# Patient Record
Sex: Female | Born: 1940 | Race: White | Hispanic: No | Marital: Married | Smoking: Never smoker
Health system: Southern US, Community
[De-identification: ages and names within clinical notes are randomized; demographics above are authoritative.]

## PROBLEM LIST (undated history)

## (undated) DIAGNOSIS — E669 Obesity, unspecified: Secondary | ICD-10-CM

## (undated) DIAGNOSIS — D472 Monoclonal gammopathy: Secondary | ICD-10-CM

## (undated) DIAGNOSIS — E1169 Type 2 diabetes mellitus with other specified complication: Secondary | ICD-10-CM

## (undated) DIAGNOSIS — I10 Essential (primary) hypertension: Secondary | ICD-10-CM

## (undated) DIAGNOSIS — K5090 Crohn's disease, unspecified, without complications: Secondary | ICD-10-CM

---

## 2006-10-06 ENCOUNTER — Inpatient Hospital Stay (HOSPITAL_COMMUNITY): Admission: RE | Admit: 2006-10-06 | Discharge: 2006-10-10 | Payer: Self-pay | Admitting: Orthopedic Surgery

## 2018-12-15 ENCOUNTER — Other Ambulatory Visit: Payer: Self-pay

## 2018-12-15 ENCOUNTER — Emergency Department
Admission: EM | Admit: 2018-12-15 | Discharge: 2018-12-15 | Disposition: A | Discharge status: Home / Self Care | Payer: Medicare HMO | Source: Home / Self Care

## 2018-12-15 ENCOUNTER — Encounter: Payer: Self-pay | Admitting: Emergency Medicine

## 2018-12-15 DIAGNOSIS — W010XXA Fall on same level from slipping, tripping and stumbling without subsequent striking against object, initial encounter: Secondary | ICD-10-CM

## 2018-12-15 DIAGNOSIS — L03116 Cellulitis of left lower limb: Secondary | ICD-10-CM

## 2018-12-15 DIAGNOSIS — S81812A Laceration without foreign body, left lower leg, initial encounter: Secondary | ICD-10-CM | POA: Diagnosis not present

## 2018-12-15 MED ORDER — CLINDAMYCIN HCL 300 MG PO CAPS: 300.0000 mg | ORAL_CAPSULE | Freq: Three times a day (TID) | ORAL | 0 refills | Status: DC

## 2018-12-15 NOTE — ED Provider Notes (Signed)
[facility]    CSN: [id number] Arrival date & time: 12/15/18  1153     History   Chief Complaint Chief Complaint  Patient presents with  . Leg Injury    HPI Katherine Benson is a 78 y.o. female.   HPI  Katherine Benson is a 78 y.o. female presenting to UC with c/o gradually worsening pain, redness, and swelling to her Left lower leg for 2 days. Two days ago pt missed a step on her back porch causing her to stumble, her husband caught her before she fell but she still scraped the front of her shin resulting in 2 skin tears. Pain is aching and sore. Leg is hot to the touch. She and her husband were trying to treat the wound at home with Neosporin and iodine but wound kept getting worse.  Hx of cellulitis in the past. She never needed to be hospitalized at that time but did receive antibiotics. 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 Denies fever, chills, n/v/d.    History reviewed. No pertinent past medical history.  There are no active problems to display for this patient.   History reviewed. No pertinent surgical history.  OB History   No obstetric history on file.      Home Medications    Prior to Admission medications   Medication Sig Start Date End Date Taking? Authorizing Provider  Dicyclomine HCl (BENTYL PO) Take by mouth.   Yes [provider]  Omeprazole (PRILOSEC PO) Take by mouth.   Yes [provider]  clindamycin (CLEOCIN) 300 MG capsule Take 1 capsule (300 mg total) by mouth 3 (three) times daily for 7 days. 12/15/18 12/22/18  Noe Gens, PA-C    Family History No family history on file.  Social History Social History   Tobacco Use  . Smoking status: Never Smoker  . Smokeless tobacco: Never Used  Substance Use Topics  . Alcohol use: Not on file  . Drug use: Not on file     Allergies   Penicillins; Sulfa antibiotics; and Erythromycin   Review of Systems Review of Systems  Constitutional: Negative for chills  and fever.  Cardiovascular: Positive for leg swelling (Left lower).  Musculoskeletal: Positive for myalgias. Negative for arthralgias.  Skin: Positive for color change and wound.  Neurological: Negative for weakness and numbness.     Physical Exam Triage Vital Signs ED Triage Vitals  Enc Vitals Group     BP 12/15/18 1224 (!) 161/95     Pulse Rate 12/15/18 1224 89     Resp --      Temp 12/15/18 1224 98.2 F (36.8 C)     Temp Source 12/15/18 1224 Oral     SpO2 12/15/18 1224 97 %     Weight 12/15/18 1225 145 lb 4 oz (65.9 kg)     Height 12/15/18 1225 5' (1.524 m)     Head Circumference --      Peak Flow --      Pain Score 12/15/18 1225 0     Pain Loc --      Pain Edu? --      Excl. in [place]? --    No data found.  Updated Vital Signs BP (!) 161/95 (BP Location: Right Arm)   Pulse 89   Temp 98.2 F (36.8 C) (Oral)   Ht 5' (1.524 m)   Wt 145 lb 4 oz (65.9 kg)   SpO2 97%   BMI 28.37 kg/m  Visual Acuity Right Eye Distance:   Left Eye Distance:   Bilateral Distance:    Right Eye Near:   Left Eye Near:    Bilateral Near:     Physical Exam Vitals signs and nursing note reviewed.  Constitutional:      Appearance: She is well-developed.  HENT:     Head: Normocephalic and atraumatic.  Neck:     Musculoskeletal: Normal range of motion.  Cardiovascular:     Rate and Rhythm: Normal rate.  Pulmonary:     Effort: Pulmonary effort is normal.  Musculoskeletal: Normal range of motion.        General: Tenderness present.     Comments: Left lower leg: mild to moderate edema (compression stocking on Right leg- chronic edema). Calf is soft, mild diffuse tenderness to lower leg.  Full ROM knee and ankle.  Skin:    General: Skin is warm and dry.     Findings: Abrasion and erythema present.          Comments: Left lower leg: 2 large superficial skin tears to anterior leg. Surrounding erythema and warmth. Tender. No drainage. No induration or fluctuance.   Neurological:      Mental Status: She is alert and oriented to person, place, and time.  Psychiatric:        Behavior: Behavior normal.      UC Treatments / Results  Labs (all labs ordered are listed, but only abnormal results are displayed) Labs Reviewed - No data to display  EKG None  Radiology No results found.  Procedures Procedures (including critical care time)  Medications Ordered in UC Medications - No data to display  Initial Impression / Assessment and Plan / UC Course  I have reviewed the triage vital signs and the nursing notes.  Pertinent labs & imaging results that were available during my care of the patient were reviewed by me and considered in my medical decision making (see chart for details).    Wound flushed with 60cc of warm water and hibiclens. Pat dried. Mepilex bandage and ace wrap applied to lower leg Will start pt on Clindamycin.  Considered Rocephin IM in UC, however, pt is allergic to PCN  Encouraged f/u in 2 days Discussed symptoms that warrant emergent care in the ED. AVS provided.  Final Clinical Impressions(s) / UC Diagnoses   Final diagnoses:  Fall from slip, trip, or stumble, initial encounter  Cellulitis of left lower leg  Tear of skin of multiple sites of left lower extremity, initial encounter     Discharge Instructions      It is recommended that you keep your leg elevated as this weekend. Keep ace wrap in place unless it becomes too tight.  If it feels too loose or too tight, you may adjust it to provide mild compression to your lower leg.  Keep bandage in place until you are re-evaluated at this facility on Monday to make sure your wound is healing properly.  If symptoms significantly worsening this weekend, please go to the hospital for further evaluation and treatment.     ED Prescriptions    Medication Sig Dispense Auth. Provider   clindamycin (CLEOCIN) 300 MG capsule Take 1 capsule (300 mg total) by mouth 3 (three) times daily  for 7 days. 21 capsule Noe Gens, PA-C     Controlled Substance Prescriptions [place] Controlled Substance Registry consulted? Not Applicable   Tyrell Antonio 12/15/18 1340

## 2018-12-15 NOTE — ED Triage Notes (Signed)
Patient injured her left lower leg Thursday afternoon, now swelling, pain, redness.

## 2018-12-15 NOTE — Discharge Instructions (Signed)
°  It is recommended that you keep your leg elevated as this weekend. Keep ace wrap in place unless it becomes too tight.  If it feels too loose or too tight, you may adjust it to provide mild compression to your lower leg.  Keep bandage in place until you are re-evaluated at this facility on Monday to make sure your wound is healing properly.  If symptoms significantly worsening this weekend, please go to the hospital for further evaluation and treatment.

## 2018-12-17 ENCOUNTER — Other Ambulatory Visit: Payer: Self-pay

## 2018-12-17 ENCOUNTER — Emergency Department (INDEPENDENT_AMBULATORY_CARE_PROVIDER_SITE_OTHER)
Admission: EM | Admit: 2018-12-17 | Discharge: 2018-12-17 | Disposition: A | Discharge status: Home / Self Care | Payer: Medicare HMO | Source: Home / Self Care | Attending: Family Medicine | Admitting: Family Medicine

## 2018-12-17 DIAGNOSIS — Z5189 Encounter for other specified aftercare: Secondary | ICD-10-CM

## 2018-12-17 HISTORY — DX: Essential (primary) hypertension: I10

## 2018-12-17 NOTE — ED Triage Notes (Signed)
Pt fell on Saturday and was seen for left leg injury.  Skin tears and redness noted on lower left leg.

## 2018-12-17 NOTE — ED Provider Notes (Signed)
[facility]    CSN: [id number] Arrival date & time: 12/17/18  0904     History   Chief Complaint Chief Complaint  Patient presents with  . Wound Check    HPI Katherine Benson is a 78 y.o. female.   Patient return for follow-up of abrasions/cellulitis of left lower anterior leg.  She was started on clindamycin two days ago.  She reports that she has noticed significant decrease in pain.  She has worn her Mepelex dressing and ace wrap continuously.  She denies fevers, chills, and sweats.  No chest pain or shortness of breath.  The history is provided by the patient.    Past Medical History:  Diagnosis Date  . Hypertension     There are no active problems to display for this patient.   History reviewed. No pertinent surgical history.  OB History   No obstetric history on file.      Home Medications    Prior to Admission medications   Medication Sig Start Date End Date Taking? Authorizing Provider  calcium carbonate (TUMS - DOSED IN MG ELEMENTAL CALCIUM) 500 MG chewable tablet Chew by mouth. 04/02/10  Yes [provider]  Biotin (SUPER BIOTIN) 5 MG TABS Take by mouth.    [provider]  clindamycin (CLEOCIN) 300 MG capsule Take 1 capsule (300 mg total) by mouth 3 (three) times daily for 7 days. 12/15/18 12/22/18  Noe Gens, PA-C  denosumab (PROLIA) 60 MG/ML SOSY injection Inject into the skin.    [provider]  dicyclomine (BENTYL) 10 MG capsule  10/11/18   [provider]  Dicyclomine HCl (BENTYL PO) Take by mouth.    [provider]  docusate sodium (COLACE) 100 MG capsule Take by mouth.    [provider]  Omeprazole (PRILOSEC PO) Take by mouth.    [provider]  omeprazole (PRILOSEC) 40 MG capsule TAKE ONE CAPSULE (40 MG DOSE) BY MOUTH DAILY. 11/01/18   [provider]  triamcinolone (NASACORT) 55 MCG/ACT AERO nasal inhaler Place into the nose.    [provider]     Family History History reviewed. No pertinent family history.  Social History Social History   Tobacco Use  . Smoking status: Never Smoker  . Smokeless tobacco: Never Used  Substance Use Topics  . Alcohol use: Not on file  . Drug use: Not on file     Allergies   Penicillins; Sulfa antibiotics; and Erythromycin   Review of Systems Review of Systems  Constitutional: Negative for activity change, appetite change, chills, diaphoresis, fatigue and fever.  Musculoskeletal:       Left lower leg pain/swelling  All other systems reviewed and are negative.    Physical Exam Triage Vital Signs ED Triage Vitals  Enc Vitals Group     BP 12/17/18 0932 (!) 183/104     Pulse Rate 12/17/18 0932 86     Resp 12/17/18 0932 20     Temp --      Temp src --      SpO2 12/17/18 0932 93 %     Weight --      Height --      Head Circumference --      Peak Flow --      Pain Score 12/17/18 0933 0     Pain Loc --      Pain Edu? --      Excl. in [place]? --    No data found.  Updated  Vital Signs BP (!) 183/104 (BP Location: Right Arm)   Pulse 86   Resp 20   SpO2 93%   Visual Acuity Right Eye Distance:   Left Eye Distance:   Bilateral Distance:    Right Eye Near:   Left Eye Near:    Bilateral Near:     Physical Exam Nursing note reviewed.  Constitutional:      General: She is not in acute distress. HENT:     Head: Normocephalic.  Eyes:     Pupils: Pupils are equal, round, and reactive to light.  Cardiovascular:     Rate and Rhythm: Normal rate.  Pulmonary:     Effort: Pulmonary effort is normal.  Musculoskeletal:     Left lower leg: She exhibits tenderness.       Legs:     Comments: Left lower leg pre-tibial area has two skin tear/abrasions with good granulation tissue and no purulent drainage.  The surrounding area remains erythematous, as noted on diagram, and tender to palpation.  Minimal edema present.  Pedal pulses intact. No calf tenderness to palpation.  Skin:     General: Skin is warm and dry.  Neurological:     Mental Status: She is alert.      UC Treatments / Results  Labs (all labs ordered are listed, but only abnormal results are displayed) Labs Reviewed  WOUND CULTURE    EKG None  Radiology No results found.  Procedures Procedures (including critical care time)  Medications Ordered in UC Medications - No data to display  Initial Impression / Assessment and Plan / UC Course  I have reviewed the triage vital signs and the nursing notes.  Pertinent labs & imaging results that were available during my care of the patient were reviewed by me and considered in my medical decision making (see chart for details).    Cellulitis slightly improved. Cleaned wound gently with sterile saline; wound culture pending. Reapplied Mepelex Border dressings (2 dressings:  10cm by 10cm). Applied ace wraps from toes to below knee in graduated compression fashion. Return in 2 days for follow-up.   Final Clinical Impressions(s) / UC Diagnoses   Final diagnoses:  Visit for wound check     Discharge Instructions     Wear ace wrap.  Elevate leg whenever possible.  Leave bandage in place until follow-up visit. Continue clindaymcin. May take Tylenol if needed for pain. If symptoms become significantly worse during the night or over the weekend, proceed to the local emergency room.     ED Prescriptions    None         Kandra Nicolas, MD 12/17/18 1047

## 2018-12-17 NOTE — Discharge Instructions (Addendum)
Wear ace wrap.  Elevate leg whenever possible. Continue clindaymcin. May take Tylenol if needed for pain. If symptoms become significantly worse during the night or over the weekend, proceed to the local emergency room.

## 2018-12-19 ENCOUNTER — Emergency Department (INDEPENDENT_AMBULATORY_CARE_PROVIDER_SITE_OTHER)
Admission: EM | Admit: 2018-12-19 | Discharge: 2018-12-19 | Disposition: A | Discharge status: Home / Self Care | Payer: Medicare HMO | Source: Home / Self Care | Attending: Family Medicine | Admitting: Family Medicine

## 2018-12-19 DIAGNOSIS — Z5189 Encounter for other specified aftercare: Secondary | ICD-10-CM | POA: Diagnosis not present

## 2018-12-19 NOTE — ED Provider Notes (Signed)
[name]    CSN: [id number] Arrival date & time: 12/19/18  3220     History   Chief Complaint Chief Complaint  Patient presents with  . Wound Check    HPI Macklyn Glandon is a 78 y.o. female.   Patient returns for dressing change to wounds on her left lower leg without complaints.  The history is provided by the patient.    Past Medical History:  Diagnosis Date  . Hypertension     There are no active problems to display for this patient.   History reviewed. No pertinent surgical history.  OB History   No obstetric history on file.      Home Medications    Prior to Admission medications   Medication Sig Start Date End Date Taking? Authorizing Provider  Biotin (SUPER BIOTIN) 5 MG TABS Take by mouth.    [provider]  calcium carbonate (TUMS - DOSED IN MG ELEMENTAL CALCIUM) 500 MG chewable tablet Chew by mouth. 04/02/10   [provider]  clindamycin (CLEOCIN) 300 MG capsule Take 1 capsule (300 mg total) by mouth 3 (three) times daily for 7 days. 12/15/18 12/22/18  Noe Gens, PA-C  denosumab (PROLIA) 60 MG/ML SOSY injection Inject into the skin.    [provider]  dicyclomine (BENTYL) 10 MG capsule  10/11/18   [provider]  Dicyclomine HCl (BENTYL PO) Take by mouth.    [provider]  docusate sodium (COLACE) 100 MG capsule Take by mouth.    [provider]  Omeprazole (PRILOSEC PO) Take by mouth.    [provider]  omeprazole (PRILOSEC) 40 MG capsule TAKE ONE CAPSULE (40 MG DOSE) BY MOUTH DAILY. 11/01/18   [provider]  triamcinolone (NASACORT) 55 MCG/ACT AERO nasal inhaler Place into the nose.    [provider]    Family History History reviewed. No pertinent family history.  Social History Social History   Tobacco Use  . Smoking status: Never Smoker  . Smokeless tobacco: Never Used  Substance Use Topics  . Alcohol use: Not on file  . Drug use: Not  on file     Allergies   Penicillins; Sulfa antibiotics; and Erythromycin   Review of Systems Review of Systems Patient reports less pain in her left lower leg.  She denies fevers, chills, and sweats.  Physical Exam Triage Vital Signs ED Triage Vitals  Enc Vitals Group     BP 12/19/18 0921 (!) 170/93     Pulse Rate 12/19/18 0921 90     Resp 12/19/18 0921 20     Temp 12/19/18 0921 97.9 F (36.6 C)     Temp Source 12/19/18 0921 Oral     SpO2 12/19/18 0921 93 %     Weight --      Height --      Head Circumference --      Peak Flow --      Pain Score 12/19/18 0922 0     Pain Loc --      Pain Edu? --      Excl. in [place]? --    No data found.  Updated Vital Signs BP (!) 170/93 (BP Location: Right Arm)   Pulse 90   Temp 97.9 F (36.6 C) (Oral)   Resp 20   SpO2 93%   Visual Acuity Right Eye Distance:   Left Eye Distance:   Bilateral Distance:    Right Eye Near:   Left Eye Near:  Bilateral Near:     Physical Exam Nursing notes and Vital Signs reviewed. Appearance:  Patient appears stated age, and in no acute distress.    Left lower leg after removal of dressings:  Excellent granulation tissue without drainage.  Minimal surrounding erythema and tenderness to palpation.  No edema.    UC Treatments / Results  Labs (all labs ordered are listed, but only abnormal results are displayed) Labs Reviewed - No data to display  EKG None  Radiology No results found.  Procedures Procedures (including critical care time)  Medications Ordered in UC Medications - No data to display  Initial Impression / Assessment and Plan / UC Course  I have reviewed the triage vital signs and the nursing notes.  Pertinent labs & imaging results that were available during my care of the patient were reviewed by me and considered in my medical decision making (see chart for details).    Wounds healing well.  Cellulitis resolving.  Culture pending. Cleaned wounds with sterile  saline.  Reapplied Mepelex Border dressings and ace wraps in graduated compression fashion. Return in 3 days for wound check and replace Mepelex dressings.   Final Clinical Impressions(s) / UC Diagnoses   Final diagnoses:  Visit for wound check     Discharge Instructions         Wear ace wrap.  Elevate leg whenever possible.  Leave bandage in place until follow-up visit. Finish clindaymcin. May take Tylenol if needed for pain. If symptoms become significantly worse during the night or over the weekend, proceed to the local emergency room.     ED Prescriptions    None        Kandra Nicolas, MD 12/20/18 1527

## 2018-12-19 NOTE — ED Triage Notes (Signed)
Pt here for wound check of left leg.

## 2018-12-19 NOTE — Discharge Instructions (Addendum)
°  Wear ace wrap.  Elevate leg whenever possible.  Leave bandage in place until follow-up visit. Finish clindaymcin. May take Tylenol if needed for pain. If symptoms become significantly worse during the night or over the weekend, proceed to the local emergency room.

## 2018-12-20 ENCOUNTER — Telehealth: Payer: Self-pay

## 2018-12-20 LAB — WOUND CULTURE
MICRO NUMBER:: 377326
RESULT:: NO GROWTH
SPECIMEN QUALITY:: ADEQUATE

## 2018-12-20 NOTE — Telephone Encounter (Signed)
Called patient today with lab results from recent visit.  Informed her of negative lab results and to insure to be back on Saturday for follow up wound care.

## 2018-12-22 ENCOUNTER — Emergency Department
Admission: EM | Admit: 2018-12-22 | Discharge: 2018-12-22 | Disposition: A | Discharge status: Home / Self Care | Payer: Medicare HMO | Source: Home / Self Care | Attending: Family Medicine | Admitting: Family Medicine

## 2018-12-22 ENCOUNTER — Other Ambulatory Visit: Payer: Self-pay

## 2018-12-22 ENCOUNTER — Encounter: Payer: Self-pay | Admitting: Emergency Medicine

## 2018-12-22 DIAGNOSIS — Z4800 Encounter for change or removal of nonsurgical wound dressing: Secondary | ICD-10-CM

## 2018-12-22 MED ORDER — MUPIROCIN 2 % EX OINT: 1.0000 "application " | TOPICAL_OINTMENT | Freq: Two times a day (BID) | CUTANEOUS | 0 refills | Status: DC

## 2018-12-22 MED ORDER — CLINDAMYCIN HCL 300 MG PO CAPS: 300.0000 mg | ORAL_CAPSULE | Freq: Three times a day (TID) | ORAL | 0 refills | Status: AC

## 2018-12-22 NOTE — ED Triage Notes (Signed)
Patient here for recheck of wound on left lower leg and dressing change.  Redness in area is better, patient feels better, has one day of antibiotics left.

## 2018-12-22 NOTE — ED Provider Notes (Signed)
[name]    CSN: [id number] Arrival date & time: 12/22/18  4944     History   Chief Complaint Chief Complaint  Patient presents with  . Wound check    HPI Gurneet Matarese is a 78 y.o. female.   Patient returns for follow-up of abrasions/cellulitis on left lower leg.  She reports resolution of pain, and minimal erythema around the wound.  She continues to wear ace wraps and elevate leg.  The history is provided by the patient.    Past Medical History:  Diagnosis Date  . Hypertension     There are no active problems to display for this patient.   History reviewed. No pertinent surgical history.  OB History   No obstetric history on file.      Home Medications    Prior to Admission medications   Medication Sig Start Date End Date Taking? Authorizing Provider  Biotin (SUPER BIOTIN) 5 MG TABS Take by mouth.    [provider]  calcium carbonate (TUMS - DOSED IN MG ELEMENTAL CALCIUM) 500 MG chewable tablet Chew by mouth. 04/02/10   [provider]  clindamycin (CLEOCIN) 300 MG capsule Take 1 capsule (300 mg total) by mouth 3 (three) times daily for 5 days. 12/22/18 12/27/18  Kandra Nicolas, MD  denosumab (PROLIA) 60 MG/ML SOSY injection Inject into the skin.    [provider]  dicyclomine (BENTYL) 10 MG capsule  10/11/18   [provider]  Dicyclomine HCl (BENTYL PO) Take by mouth.    [provider]  docusate sodium (COLACE) 100 MG capsule Take by mouth.    [provider]  mupirocin ointment (BACTROBAN) 2 % Apply 1 application topically 2 (two) times daily. 12/22/18   Kandra Nicolas, MD  Omeprazole (PRILOSEC PO) Take by mouth.    [provider]  omeprazole (PRILOSEC) 40 MG capsule TAKE ONE CAPSULE (40 MG DOSE) BY MOUTH DAILY. 11/01/18   [provider]  triamcinolone (NASACORT) 55 MCG/ACT AERO nasal inhaler Place into the nose.    [provider]    Family History No  family history on file.  Social History Social History   Tobacco Use  . Smoking status: Never Smoker  . Smokeless tobacco: Never Used  Substance Use Topics  . Alcohol use: Not on file  . Drug use: Not on file     Allergies   Penicillins; Sulfa antibiotics; and Erythromycin   Review of Systems Review of Systems  Constitutional: Negative for chills, diaphoresis, fatigue and fever.  Skin: Negative for color change.  All other systems reviewed and are negative.    Physical Exam Triage Vital Signs ED Triage Vitals [12/22/18 0925]  Enc Vitals Group     BP (!) 183/98     Pulse Rate 87     Resp      Temp 97.9 F (36.6 C)     Temp Source Oral     SpO2 91 %     Weight      Height      Head Circumference      Peak Flow      Pain Score 0     Pain Loc      Pain Edu?      Excl. in [place]?    No data found.  Updated Vital Signs BP (!) 183/98 (BP Location: Right Arm)   Pulse 87   Temp 97.9 F (36.6 C) (Oral)   SpO2 91%   Visual Acuity  Right Eye Distance:   Left Eye Distance:   Bilateral Distance:    Right Eye Near:   Left Eye Near:    Bilateral Near:     Physical Exam Vitals signs and nursing note reviewed.  Constitutional:      General: She is not in acute distress. Cardiovascular:     Rate and Rhythm: Normal rate.  Pulmonary:     Effort: Pulmonary effort is normal.  Musculoskeletal:     Comments: Wounds over the pretibial area of the left lower leg reveal excellent granulation tissue and no purulent drainage.  Surrounding erythema is almost completely resolved, and there is minimal tenderness to palpation around the wound.  Neurological:     Mental Status: She is alert.      UC Treatments / Results  Labs (all labs ordered are listed, but only abnormal results are displayed) Labs Reviewed - No data to display  EKG None  Radiology No results found.  Procedures Procedures (including critical care time)  Medications Ordered in UC Medications -  No data to display  Initial Impression / Assessment and Plan / UC Course  I have reviewed the triage vital signs and the nursing notes.  Pertinent labs & imaging results that were available during my care of the patient were reviewed by me and considered in my medical decision making (see chart for details).    Wounds healing well; excellent granulation tissue present.  Cellulitis almost completely resolved.  Cleaned wounds with sterile saline.  Applied [employer] dressing.  Reapplied ace wraps in graduated compression fashion. Rx for clindamycin 300mg  #15. Rx for mupirocin ointment for use after finishing clindamycin ointment. After removing bandage in five days, may begin daily dressing at home with Telfa (patient may return here in five days for follow-up if she wishes).   Final Clinical Impressions(s) / UC Diagnoses   Final diagnoses:  Change of dressing     Discharge Instructions     Continue Clindamycin for five more days.  Leave today's bandage in place for five days, then remove.  At that time may begin daily dressing change with mupirocin ointment and Telfa non-stick bandage.  Continue to wear Ace wraps to minimize swelling.    ED Prescriptions    Medication Sig Dispense Auth. Provider   clindamycin (CLEOCIN) 300 MG capsule Take 1 capsule (300 mg total) by mouth 3 (three) times daily for 5 days. 15 capsule Kandra Nicolas, MD   mupirocin ointment (BACTROBAN) 2 % Apply 1 application topically 2 (two) times daily. 30 g Kandra Nicolas, MD         Kandra Nicolas, MD 12/31/18 1016

## 2018-12-22 NOTE — Discharge Instructions (Addendum)
Continue Clindamycin for five more days.  Leave today's bandage in place for five days, then remove.  At that time may begin daily dressing change with mupirocin ointment and Telfa non-stick bandage.  Continue to wear Ace wraps to minimize swelling.

## 2018-12-31 ENCOUNTER — Telehealth: Payer: Self-pay

## 2018-12-31 NOTE — Telephone Encounter (Signed)
Spoke with patient, stated that legs are better.  Still having some redness and swelling.  Wounds are healing without sx of infection.  Pt will follow up as needed.

## 2019-09-15 ENCOUNTER — Other Ambulatory Visit: Payer: Self-pay

## 2019-09-15 ENCOUNTER — Emergency Department
Admission: EM | Admit: 2019-09-15 | Discharge: 2019-09-15 | Disposition: A | Discharge status: Home / Self Care | Payer: Medicare HMO | Source: Home / Self Care | Attending: Family Medicine | Admitting: Family Medicine

## 2019-09-15 DIAGNOSIS — R300 Dysuria: Secondary | ICD-10-CM

## 2019-09-15 DIAGNOSIS — H9203 Otalgia, bilateral: Secondary | ICD-10-CM

## 2019-09-15 LAB — POCT URINALYSIS DIP (MANUAL ENTRY)
Bilirubin, UA: NEGATIVE
Blood, UA: NEGATIVE
Glucose, UA: NEGATIVE mg/dL
Ketones, POC UA: NEGATIVE mg/dL
Leukocytes, UA: NEGATIVE
Nitrite, UA: NEGATIVE
Protein Ur, POC: NEGATIVE mg/dL
Spec Grav, UA: 1.015 (ref 1.010–1.025)
Urobilinogen, UA: 0.2 E.U./dL
pH, UA: 7.5 (ref 5.0–8.0)

## 2019-09-15 MED ORDER — NITROFURANTOIN MACROCRYSTAL 50 MG PO CAPS: ORAL_CAPSULE | ORAL | 0 refills | Status: DC

## 2019-09-15 NOTE — ED Triage Notes (Signed)
Pt c/o urinary frequency with some burning x 3-4 days. Took left over ciprofloxacin yesterday (2 doses)

## 2019-09-15 NOTE — ED Provider Notes (Signed)
Katherine Benson CARE    CSN: [id number] Arrival date & time: 09/15/19  1058      History   Chief Complaint Chief Complaint  Patient presents with  . Urinary Frequency    HPI Katherine Benson is a 79 y.o. female.   Patient complains of dysuria and frequency for two days.  She took two doses of Cipro yesterday.  She reports that she was recently treated for an earache, and still has mild ear discomfort.  The history is provided by the patient.  Dysuria Pain quality:  Burning Pain severity:  Mild Onset quality:  Sudden Duration:  2 days Timing:  Constant Progression:  Unchanged Chronicity:  Recurrent Recent urinary tract infections: yes   Relieved by:  Nothing Ineffective treatments:  Antibiotics Urinary symptoms: frequent urination and hesitancy   Urinary symptoms: no discolored urine, no foul-smelling urine, no hematuria and no bladder incontinence   Associated symptoms: no abdominal pain, no fever, no flank pain, no genital lesions, no nausea, no vaginal discharge and no vomiting   Risk factors: no kidney transplant     Past Medical History:  Diagnosis Date  . Hypertension     There are no problems to display for this patient.   History reviewed. No pertinent surgical history.  OB History   No obstetric history on file.      Home Medications    Prior to Admission medications   Medication Sig Start Date End Date Taking? Authorizing Provider  Biotin (SUPER BIOTIN) 5 MG TABS Take by mouth.    [provider]  calcium carbonate (TUMS - DOSED IN MG ELEMENTAL CALCIUM) 500 MG chewable tablet Chew by mouth. 04/02/10   [provider]  denosumab (PROLIA) 60 MG/ML SOSY injection Inject into the skin.    [provider]  dicyclomine (BENTYL) 10 MG capsule  10/11/18   [provider]  Dicyclomine HCl (BENTYL PO) Take by mouth.    [provider]  docusate sodium (COLACE) 100 MG capsule Take by mouth.    [provider]  mupirocin ointment (BACTROBAN) 2 % Apply 1 application topically 2 (two) times daily. 12/22/18   Kandra Nicolas, MD  nitrofurantoin (MACRODANTIN) 50 MG capsule Take one cap PO Q6hr for 5 days. 09/15/19   Kandra Nicolas, MD  Omeprazole (PRILOSEC PO) Take by mouth.    [provider]  omeprazole (PRILOSEC) 40 MG capsule TAKE ONE CAPSULE (40 MG DOSE) BY MOUTH DAILY. 11/01/18   [provider]  triamcinolone (NASACORT) 55 MCG/ACT AERO nasal inhaler Place into the nose.    [provider]    Family History History reviewed. No pertinent family history.  Social History Social History   Tobacco Use  . Smoking status: Never Smoker  . Smokeless tobacco: Never Used  Substance Use Topics  . Alcohol use: Not on file  . Drug use: Not on file     Allergies   Penicillins, Sulfa antibiotics, and Erythromycin   Review of Systems Review of Systems  Constitutional: Negative for fever.  HENT: Positive for ear pain. Negative for ear discharge.   Gastrointestinal: Negative for abdominal pain, nausea and vomiting.  Endocrine: Negative for heat intolerance.  Genitourinary: Positive for dysuria and frequency. Negative for flank pain and vaginal discharge.  All other systems reviewed and are negative.  No sore throat No cough No pleuritic pain No wheezing No nasal congestion No post-nasal drainage No sinus pain/pressure No itchy/red eyes ? earache No hemoptysis No SOB No  fever/chills No nausea No vomiting No abdominal pain No diarrhea + dysuria, frequency No skin rash No fatigue No myalgias No headache   Physical Exam Triage Vital Signs ED Triage Vitals  Enc Vitals Group     BP 09/15/19 1343 (!) 178/116     Pulse Rate 09/15/19 1343 89     Resp 09/15/19 1343 18     Temp 09/15/19 1343 97.9 F (36.6 C)     Temp Source 09/15/19 1343 Oral     SpO2 09/15/19 1343 94 %     Weight 09/15/19 1345 153 lb (69.4 kg)     Height --      Head Circumference  --      Peak Flow --      Pain Score 09/15/19 1345 0     Pain Loc --      Pain Edu? --      Excl. in [place]? --    No data found.  Updated Vital Signs BP (!) 178/116 (BP Location: Right Arm)   Pulse 89   Temp 97.9 F (36.6 C) (Oral)   Resp 18   Wt 69.4 kg   SpO2 94%   BMI 29.88 kg/m   Visual Acuity Right Eye Distance:   Left Eye Distance:   Bilateral Distance:    Right Eye Near:   Left Eye Near:    Bilateral Near:     Physical Exam Nursing notes and Vital Signs reviewed. Appearance:  Patient appears stated age, and in no acute distress Eyes:  Pupils are equal, round, and reactive to light and accomodation.  Extraocular movement is intact.  Conjunctivae are not inflamed  Ears:  Canals normal.  Tympanic membranes normal.  Nose:   Normal turbinates.  No sinus tenderness.  Pharynx:  Normal Neck:  Supple.  No adenopathy  Lungs:  Clear to auscultation.  Breath sounds are equal.  Moving air well. Heart:  Regular rate and rhythm without murmurs, rubs, or gallops.  Abdomen:  Nontender without masses or hepatosplenomegaly.  Bowel sounds are present.  No CVA or flank tenderness.  Extremities:  No edema.  Skin:  No rash present.   UC Treatments / Results  Labs (all labs ordered are listed, but only abnormal results are displayed) Labs Reviewed  URINE CULTURE  POCT URINALYSIS DIP (MANUAL ENTRY) negative  Tympanometry:  Right ear tympanogram wide; Left ear tympanogram normal  EKG   Radiology No results found.  Procedures Procedures (including critical care time)  Medications Ordered in UC Medications - No data to display  Initial Impression / Assessment and Plan / UC Course  I have reviewed the triage vital signs and the nursing notes.  Pertinent labs & imaging results that were available during my care of the patient were reviewed by me and considered in my medical decision making (see chart for details).    No evidence otitis media. Begin Macrodantin.  Urine  culture pending. Followup with Family Doctor if not improved in five days.   Final Clinical Impressions(s) / UC Diagnoses   Final diagnoses:  Dysuria  Otalgia, bilateral     Discharge Instructions     Increase fluid intake. If symptoms become significantly worse during the night or over the weekend, proceed to the local emergency room.     ED Prescriptions    Medication Sig Dispense Auth. Provider   nitrofurantoin (MACRODANTIN) 50 MG capsule Take one cap PO Q6hr for 5 days. 20 capsule Kandra Nicolas, MD  Kandra Nicolas, MD 09/21/19 1240

## 2019-09-15 NOTE — Discharge Instructions (Addendum)
Increase fluid intake. °If symptoms become significantly worse during the night or over the weekend, proceed to the local emergency room.  °

## 2019-09-16 LAB — URINE CULTURE
MICRO NUMBER:: 10002846
Result:: NO GROWTH
SPECIMEN QUALITY:: ADEQUATE

## 2019-09-17 ENCOUNTER — Telehealth: Payer: Self-pay

## 2020-05-19 ENCOUNTER — Encounter (HOSPITAL_COMMUNITY): Payer: Self-pay | Admitting: Emergency Medicine

## 2020-05-19 ENCOUNTER — Emergency Department (HOSPITAL_COMMUNITY): Payer: Medicare HMO

## 2020-05-19 ENCOUNTER — Observation Stay (HOSPITAL_COMMUNITY): Payer: Medicare HMO

## 2020-05-19 ENCOUNTER — Inpatient Hospital Stay (HOSPITAL_COMMUNITY)
Admission: EM | Admit: 2020-05-19 | Discharge: 2020-06-05 | DRG: 492 | Disposition: A | Discharge status: Skilled Nursing Facility | Payer: Medicare HMO | Source: Skilled Nursing Facility | Attending: Internal Medicine | Admitting: Internal Medicine

## 2020-05-19 ENCOUNTER — Other Ambulatory Visit: Payer: Self-pay

## 2020-05-19 DIAGNOSIS — R140 Abdominal distension (gaseous): Secondary | ICD-10-CM

## 2020-05-19 DIAGNOSIS — Y92009 Unspecified place in unspecified non-institutional (private) residence as the place of occurrence of the external cause: Secondary | ICD-10-CM | POA: Diagnosis not present

## 2020-05-19 DIAGNOSIS — S82142A Displaced bicondylar fracture of left tibia, initial encounter for closed fracture: Principal | ICD-10-CM | POA: Diagnosis present

## 2020-05-19 DIAGNOSIS — D62 Acute posthemorrhagic anemia: Secondary | ICD-10-CM | POA: Diagnosis not present

## 2020-05-19 DIAGNOSIS — T40411A Poisoning by fentanyl or fentanyl analogs, accidental (unintentional), initial encounter: Secondary | ICD-10-CM | POA: Diagnosis present

## 2020-05-19 DIAGNOSIS — D472 Monoclonal gammopathy: Secondary | ICD-10-CM | POA: Diagnosis present

## 2020-05-19 DIAGNOSIS — G9341 Metabolic encephalopathy: Secondary | ICD-10-CM | POA: Diagnosis present

## 2020-05-19 DIAGNOSIS — E8889 Other specified metabolic disorders: Secondary | ICD-10-CM | POA: Diagnosis present

## 2020-05-19 DIAGNOSIS — W19XXXA Unspecified fall, initial encounter: Secondary | ICD-10-CM | POA: Diagnosis not present

## 2020-05-19 DIAGNOSIS — R4189 Other symptoms and signs involving cognitive functions and awareness: Secondary | ICD-10-CM | POA: Diagnosis present

## 2020-05-19 DIAGNOSIS — T148XXA Other injury of unspecified body region, initial encounter: Secondary | ICD-10-CM

## 2020-05-19 DIAGNOSIS — E871 Hypo-osmolality and hyponatremia: Secondary | ICD-10-CM | POA: Diagnosis not present

## 2020-05-19 DIAGNOSIS — Z880 Allergy status to penicillin: Secondary | ICD-10-CM

## 2020-05-19 DIAGNOSIS — N390 Urinary tract infection, site not specified: Secondary | ICD-10-CM | POA: Diagnosis present

## 2020-05-19 DIAGNOSIS — E119 Type 2 diabetes mellitus without complications: Secondary | ICD-10-CM | POA: Diagnosis present

## 2020-05-19 DIAGNOSIS — J8489 Other specified interstitial pulmonary diseases: Secondary | ICD-10-CM | POA: Diagnosis present

## 2020-05-19 DIAGNOSIS — F0390 Unspecified dementia without behavioral disturbance: Secondary | ICD-10-CM | POA: Diagnosis present

## 2020-05-19 DIAGNOSIS — Z6830 Body mass index (BMI) 30.0-30.9, adult: Secondary | ICD-10-CM

## 2020-05-19 DIAGNOSIS — M25579 Pain in unspecified ankle and joints of unspecified foot: Secondary | ICD-10-CM

## 2020-05-19 DIAGNOSIS — S82142G Displaced bicondylar fracture of left tibia, subsequent encounter for closed fracture with delayed healing: Secondary | ICD-10-CM

## 2020-05-19 DIAGNOSIS — I10 Essential (primary) hypertension: Secondary | ICD-10-CM | POA: Diagnosis present

## 2020-05-19 DIAGNOSIS — Z1623 Resistance to quinolones and fluoroquinolones: Secondary | ICD-10-CM | POA: Diagnosis present

## 2020-05-19 DIAGNOSIS — B961 Klebsiella pneumoniae [K. pneumoniae] as the cause of diseases classified elsewhere: Secondary | ICD-10-CM | POA: Diagnosis present

## 2020-05-19 DIAGNOSIS — E1169 Type 2 diabetes mellitus with other specified complication: Secondary | ICD-10-CM | POA: Diagnosis present

## 2020-05-19 DIAGNOSIS — F419 Anxiety disorder, unspecified: Secondary | ICD-10-CM | POA: Diagnosis present

## 2020-05-19 DIAGNOSIS — R0902 Hypoxemia: Secondary | ICD-10-CM

## 2020-05-19 DIAGNOSIS — S82132D Displaced fracture of medial condyle of left tibia, subsequent encounter for closed fracture with routine healing: Secondary | ICD-10-CM

## 2020-05-19 DIAGNOSIS — Z419 Encounter for procedure for purposes other than remedying health state, unspecified: Secondary | ICD-10-CM

## 2020-05-19 DIAGNOSIS — R109 Unspecified abdominal pain: Secondary | ICD-10-CM

## 2020-05-19 DIAGNOSIS — Z79899 Other long term (current) drug therapy: Secondary | ICD-10-CM

## 2020-05-19 DIAGNOSIS — E222 Syndrome of inappropriate secretion of antidiuretic hormone: Secondary | ICD-10-CM | POA: Diagnosis not present

## 2020-05-19 DIAGNOSIS — Y92239 Unspecified place in hospital as the place of occurrence of the external cause: Secondary | ICD-10-CM | POA: Diagnosis not present

## 2020-05-19 DIAGNOSIS — E559 Vitamin D deficiency, unspecified: Secondary | ICD-10-CM | POA: Diagnosis present

## 2020-05-19 DIAGNOSIS — T43225A Adverse effect of selective serotonin reuptake inhibitors, initial encounter: Secondary | ICD-10-CM | POA: Diagnosis not present

## 2020-05-19 DIAGNOSIS — Z881 Allergy status to other antibiotic agents status: Secondary | ICD-10-CM

## 2020-05-19 DIAGNOSIS — Z888 Allergy status to other drugs, medicaments and biological substances status: Secondary | ICD-10-CM

## 2020-05-19 DIAGNOSIS — W06XXXA Fall from bed, initial encounter: Secondary | ICD-10-CM | POA: Diagnosis present

## 2020-05-19 DIAGNOSIS — K5090 Crohn's disease, unspecified, without complications: Secondary | ICD-10-CM | POA: Diagnosis present

## 2020-05-19 DIAGNOSIS — Z20822 Contact with and (suspected) exposure to covid-19: Secondary | ICD-10-CM | POA: Diagnosis present

## 2020-05-19 DIAGNOSIS — I469 Cardiac arrest, cause unspecified: Secondary | ICD-10-CM | POA: Diagnosis present

## 2020-05-19 DIAGNOSIS — J90 Pleural effusion, not elsewhere classified: Secondary | ICD-10-CM | POA: Diagnosis present

## 2020-05-19 DIAGNOSIS — K219 Gastro-esophageal reflux disease without esophagitis: Secondary | ICD-10-CM | POA: Diagnosis present

## 2020-05-19 DIAGNOSIS — E669 Obesity, unspecified: Secondary | ICD-10-CM | POA: Diagnosis present

## 2020-05-19 DIAGNOSIS — R404 Transient alteration of awareness: Secondary | ICD-10-CM | POA: Diagnosis present

## 2020-05-19 DIAGNOSIS — L8990 Pressure ulcer of unspecified site, unspecified stage: Secondary | ICD-10-CM | POA: Insufficient documentation

## 2020-05-19 DIAGNOSIS — Z882 Allergy status to sulfonamides status: Secondary | ICD-10-CM

## 2020-05-19 DIAGNOSIS — Z96642 Presence of left artificial hip joint: Secondary | ICD-10-CM | POA: Diagnosis present

## 2020-05-19 HISTORY — DX: Crohn's disease, unspecified, without complications: K50.90

## 2020-05-19 HISTORY — DX: Monoclonal gammopathy: D47.2

## 2020-05-19 HISTORY — DX: Obesity, unspecified: E66.9

## 2020-05-19 HISTORY — DX: Type 2 diabetes mellitus with other specified complication: E11.69

## 2020-05-19 LAB — CBC WITH DIFFERENTIAL/PLATELET
Abs Immature Granulocytes: 0.17 10*3/uL — ABNORMAL HIGH (ref 0.00–0.07)
Basophils Absolute: 0 10*3/uL (ref 0.0–0.1)
Basophils Relative: 0 %
Eosinophils Absolute: 0 10*3/uL (ref 0.0–0.5)
Eosinophils Relative: 0 %
HCT: 32.1 % — ABNORMAL LOW (ref 36.0–46.0)
Hemoglobin: 10.9 g/dL — ABNORMAL LOW (ref 12.0–15.0)
Immature Granulocytes: 1 %
Lymphocytes Relative: 3 %
Lymphs Abs: 0.5 10*3/uL — ABNORMAL LOW (ref 0.7–4.0)
MCH: 30.9 pg (ref 26.0–34.0)
MCHC: 34 g/dL (ref 30.0–36.0)
MCV: 90.9 fL (ref 80.0–100.0)
Monocytes Absolute: 0.5 10*3/uL (ref 0.1–1.0)
Monocytes Relative: 3 %
Neutro Abs: 15 10*3/uL — ABNORMAL HIGH (ref 1.7–7.7)
Neutrophils Relative %: 93 %
Platelets: 313 10*3/uL (ref 150–400)
RBC: 3.53 MIL/uL — ABNORMAL LOW (ref 3.87–5.11)
RDW: 12.8 % (ref 11.5–15.5)
WBC: 16.2 10*3/uL — ABNORMAL HIGH (ref 4.0–10.5)
nRBC: 0 % (ref 0.0–0.2)

## 2020-05-19 LAB — TROPONIN I (HIGH SENSITIVITY)
Troponin I (High Sensitivity): 22 ng/L — ABNORMAL HIGH (ref <18)
Troponin I (High Sensitivity): 71 ng/L — ABNORMAL HIGH (ref <18)
Troponin I (High Sensitivity): 48 ng/L — ABNORMAL HIGH (ref <18)
Troponin I (High Sensitivity): 33 ng/L — ABNORMAL HIGH (ref <18)

## 2020-05-19 LAB — CK: Total CK: 76 U/L (ref 38–234)

## 2020-05-19 LAB — BASIC METABOLIC PANEL
Anion gap: 10 (ref 5–15)
BUN: 8 mg/dL (ref 8–23)
CO2: 27 mmol/L (ref 22–32)
Calcium: 8.7 mg/dL — ABNORMAL LOW (ref 8.9–10.3)
Chloride: 89 mmol/L — ABNORMAL LOW (ref 98–111)
Creatinine, Ser: 0.68 mg/dL (ref 0.44–1.00)
GFR calc Af Amer: >60 mL/min (ref >60)
GFR calc non Af Amer: >60 mL/min (ref >60)
Glucose, Bld: 147 mg/dL — ABNORMAL HIGH (ref 70–99)
Potassium: 3.4 mmol/L — ABNORMAL LOW (ref 3.5–5.1)
Sodium: 126 mmol/L — ABNORMAL LOW (ref 135–145)

## 2020-05-19 LAB — SARS CORONAVIRUS 2 BY RT PCR (HOSPITAL ORDER, PERFORMED IN ~~LOC~~ HOSPITAL LAB): SARS Coronavirus 2: NEGATIVE

## 2020-05-19 MED ORDER — DILTIAZEM HCL ER COATED BEADS 180 MG PO CP24
180.0000 mg | ORAL_CAPSULE | Freq: Every day | ORAL | Status: DC
  Administered 2020-05-19 – 2020-06-04 (×17): 180 mg via ORAL
  Filled 2020-05-19 (×18): qty 1

## 2020-05-19 MED ORDER — METHOCARBAMOL 500 MG PO TABS
500.0000 mg | ORAL_TABLET | Freq: Four times a day (QID) | ORAL | Status: DC | PRN
  Administered 2020-05-20 – 2020-05-23 (×5): 500 mg via ORAL
  Filled 2020-05-19 (×6): qty 1

## 2020-05-19 MED ORDER — POLYETHYLENE GLYCOL 3350 17 G PO PACK
17.0000 g | PACK | Freq: Every day | ORAL | Status: DC | PRN
  Administered 2020-05-25 – 2020-05-26 (×2): 17 g via ORAL
  Filled 2020-05-19 (×2): qty 1

## 2020-05-19 MED ORDER — TRIAMCINOLONE ACETONIDE 55 MCG/ACT NA AERO
1.0000 | INHALATION_SPRAY | Freq: Two times a day (BID) | NASAL | Status: DC
  Administered 2020-05-19 – 2020-06-05 (×29): 1 via NASAL
  Filled 2020-05-19 (×3): qty 10.8

## 2020-05-19 MED ORDER — POLYETHYL GLYCOL-PROPYL GLYCOL 0.4-0.3 % OP SOLN: 1.0000 [drp] | Freq: Two times a day (BID) | OPHTHALMIC | Status: DC

## 2020-05-19 MED ORDER — DICYCLOMINE HCL 10 MG PO CAPS
10.0000 mg | ORAL_CAPSULE | Freq: Two times a day (BID) | ORAL | Status: DC
  Administered 2020-05-20 – 2020-06-05 (×31): 10 mg via ORAL
  Filled 2020-05-19 (×35): qty 1

## 2020-05-19 MED ORDER — LORATADINE 10 MG PO TABS
10.0000 mg | ORAL_TABLET | Freq: Every day | ORAL | Status: DC
  Administered 2020-05-20 – 2020-06-05 (×17): 10 mg via ORAL
  Filled 2020-05-19 (×17): qty 1

## 2020-05-19 MED ORDER — ACETAMINOPHEN 325 MG PO TABS: 650.0000 mg | ORAL_TABLET | Freq: Four times a day (QID) | ORAL | Status: DC | PRN

## 2020-05-19 MED ORDER — POLYVINYL ALCOHOL 1.4 % OP SOLN
1.0000 [drp] | Freq: Two times a day (BID) | OPHTHALMIC | Status: DC
  Administered 2020-05-19 – 2020-06-05 (×32): 1 [drp] via OPHTHALMIC
  Filled 2020-05-19 (×2): qty 15

## 2020-05-19 MED ORDER — ASPIRIN EC 325 MG PO TBEC: 325.0000 mg | DELAYED_RELEASE_TABLET | Freq: Every day | ORAL | Status: DC

## 2020-05-19 MED ORDER — SENNA-DOCUSATE SODIUM 8.6-50 MG PO TABS: 1.0000 | ORAL_TABLET | Freq: Every day | ORAL | Status: DC

## 2020-05-19 MED ORDER — PROSIGHT PO TABS
1.0000 | ORAL_TABLET | Freq: Every day | ORAL | Status: DC
  Administered 2020-05-20 – 2020-06-05 (×17): 1 via ORAL
  Filled 2020-05-19 (×17): qty 1

## 2020-05-19 MED ORDER — PANTOPRAZOLE SODIUM 40 MG PO TBEC
40.0000 mg | DELAYED_RELEASE_TABLET | Freq: Every day | ORAL | Status: DC
  Administered 2020-05-20 – 2020-06-05 (×17): 40 mg via ORAL
  Filled 2020-05-19 (×17): qty 1

## 2020-05-19 MED ORDER — BISACODYL 5 MG PO TBEC
5.0000 mg | DELAYED_RELEASE_TABLET | Freq: Every day | ORAL | Status: DC | PRN
  Administered 2020-05-26 – 2020-05-28 (×2): 5 mg via ORAL
  Filled 2020-05-19 (×2): qty 1

## 2020-05-19 MED ORDER — DOCUSATE SODIUM 100 MG PO CAPS
100.0000 mg | ORAL_CAPSULE | Freq: Two times a day (BID) | ORAL | Status: DC
  Administered 2020-05-19 – 2020-06-05 (×29): 100 mg via ORAL
  Filled 2020-05-19 (×30): qty 1

## 2020-05-19 MED ORDER — PRESERVISION AREDS 2 PO CAPS: 1.0000 | ORAL_CAPSULE | Freq: Every day | ORAL | Status: DC

## 2020-05-19 MED ORDER — ESCITALOPRAM OXALATE 10 MG PO TABS
10.0000 mg | ORAL_TABLET | Freq: Every day | ORAL | Status: DC
  Administered 2020-05-20 – 2020-05-28 (×9): 10 mg via ORAL
  Filled 2020-05-19 (×9): qty 1

## 2020-05-19 MED ORDER — LACTATED RINGERS IV BOLUS
1000.0000 mL | Freq: Once | INTRAVENOUS | Status: AC
  Administered 2020-05-19: 1000 mL via INTRAVENOUS

## 2020-05-19 MED ORDER — MORPHINE SULFATE (PF) 2 MG/ML IV SOLN
0.5000 mg | INTRAVENOUS | Status: DC | PRN
  Administered 2020-05-28 – 2020-06-04 (×2): 0.5 mg via INTRAVENOUS
  Filled 2020-05-19 (×2): qty 1

## 2020-05-19 MED ORDER — DILTIAZEM HCL ER BEADS 180 MG PO CP24: 180.0000 mg | ORAL_CAPSULE | Freq: Every day | ORAL | Status: DC

## 2020-05-19 MED ORDER — ONDANSETRON HCL 4 MG/2ML IJ SOLN
4.0000 mg | Freq: Four times a day (QID) | INTRAMUSCULAR | Status: DC | PRN
  Administered 2020-05-21 – 2020-06-02 (×7): 4 mg via INTRAVENOUS
  Filled 2020-05-19 (×9): qty 2

## 2020-05-19 MED ORDER — CARBAMAZEPINE 200 MG PO TABS
200.0000 mg | ORAL_TABLET | Freq: Two times a day (BID) | ORAL | Status: DC
  Administered 2020-05-19 – 2020-05-28 (×16): 200 mg via ORAL
  Filled 2020-05-19 (×20): qty 1

## 2020-05-19 MED ORDER — ASPIRIN EC 325 MG PO TBEC
325.0000 mg | DELAYED_RELEASE_TABLET | Freq: Every day | ORAL | Status: DC
  Administered 2020-05-20 – 2020-06-05 (×17): 325 mg via ORAL
  Filled 2020-05-19 (×17): qty 1

## 2020-05-19 MED ORDER — SENNOSIDES-DOCUSATE SODIUM 8.6-50 MG PO TABS
1.0000 | ORAL_TABLET | Freq: Every day | ORAL | Status: DC
  Administered 2020-05-19 – 2020-05-24 (×6): 1 via ORAL
  Filled 2020-05-19 (×6): qty 1

## 2020-05-19 MED ORDER — HYDROCODONE-ACETAMINOPHEN 5-325 MG PO TABS
1.0000 | ORAL_TABLET | Freq: Four times a day (QID) | ORAL | Status: DC | PRN
  Administered 2020-05-20: 2 via ORAL
  Administered 2020-05-20 – 2020-06-01 (×9): 1 via ORAL
  Filled 2020-05-19: qty 2
  Filled 2020-05-19 (×11): qty 1

## 2020-05-19 MED ORDER — METHOCARBAMOL 1000 MG/10ML IJ SOLN
500.0000 mg | Freq: Four times a day (QID) | INTRAVENOUS | Status: DC | PRN
  Filled 2020-05-19: qty 5

## 2020-05-19 NOTE — Consult Note (Signed)
Reason for Consult:Left tibia plateau fx Referring Physician: [name] is an 79 y.o. female.  HPI: Katherine Benson fell about 3 weeks ago and suffered a tibia plateau fx to her left leg. She was hospitalized in [place] for about a week and then discharged to SNF for rehab. She was seen by Dr. Smitty Cords with orthopedic who advised non-operative treatment. Last night it appears she fell out of bed or tried to get up and fell again as staff found her on floor beside bed early this morning. The pt seems confused and husband provided most of history though this is not her baseline. Prior to the first fall she was ambulating and driving and rarely used a cane.  Past Medical History:  Diagnosis Date  . Hypertension     History reviewed. No pertinent surgical history.  History reviewed. No pertinent family history.  Social History:  reports that she has never smoked. She has never used smokeless tobacco. She reports that she does not drink alcohol and does not use drugs.  Allergies:  Allergies  Allergen Reactions  . Cefaclor Rash  . Mercaptopurine Other (See Comments)    Elevated liver tests Elevated liver tests Elevated liver tests   . Penicillins Rash  . Sulfa Antibiotics Rash  . Erythromycin     Medications: I have reviewed the patient's current medications.  Results for orders placed or performed during the hospital encounter of 05/19/20 (from the past 48 hour(s))  CBC with Differential/Platelet     Status: Abnormal   Collection Time: 05/19/20  5:04 AM  Result Value Ref Range   WBC 16.2 (H) 4.0 - 10.5 K/uL   RBC 3.53 (L) 3.87 - 5.11 MIL/uL   Hemoglobin 10.9 (L) 12.0 - 15.0 g/dL   HCT 32.1 (L) 36 - 46 %   MCV 90.9 80.0 - 100.0 fL   MCH 30.9 26.0 - 34.0 pg   MCHC 34.0 30.0 - 36.0 g/dL   RDW 12.8 11.5 - 15.5 %   Platelets 313 150 - 400 K/uL   nRBC 0.0 0.0 - 0.2 %   Neutrophils Relative % 93 %   Neutro Abs 15.0 (H) 1.7 - 7.7 K/uL   Lymphocytes Relative 3 %   Lymphs  Abs 0.5 (L) 0.7 - 4.0 K/uL   Monocytes Relative 3 %   Monocytes Absolute 0.5 0 - 1 K/uL   Eosinophils Relative 0 %   Eosinophils Absolute 0.0 0 - 0 K/uL   Basophils Relative 0 %   Basophils Absolute 0.0 0 - 0 K/uL   Immature Granulocytes 1 %   Abs Immature Granulocytes 0.17 (H) 0.00 - 0.07 K/uL    Comment: Performed at [facility], [address] [place], [place] [address]  Basic metabolic panel     Status: Abnormal   Collection Time: 05/19/20  5:04 AM  Result Value Ref Range   Sodium 126 (L) 135 - 145 mmol/L   Potassium 3.4 (L) 3.5 - 5.1 mmol/L   Chloride 89 (L) 98 - 111 mmol/L   CO2 27 22 - 32 mmol/L   Glucose, Bld 147 (H) 70 - 99 mg/dL    Comment: Glucose reference range applies only to samples taken after fasting for at least 8 hours.   BUN 8 8 - 23 mg/dL   Creatinine, Ser 0.68 0.44 - 1.00 mg/dL   Calcium 8.7 (L) 8.9 - 10.3 mg/dL   GFR calc non Af Amer >60 >60 mL/min   GFR calc Af Amer >  60 >60 mL/min   Anion gap 10 5 - 15    Comment: Performed at [facility] [address] [place], [place] [address]  Troponin I (High Sensitivity)     Status: Abnormal   Collection Time: 05/19/20  5:04 AM  Result Value Ref Range   Troponin I (High Sensitivity) 22 (H) <18 ng/L    Comment: (NOTE) Elevated high sensitivity troponin I (hsTnI) values and significant  changes across serial measurements may suggest ACS but many other  chronic and acute conditions are known to elevate hsTnI results.  Refer to the "Links" section for chest pain algorithms and additional  guidance. Performed at [facility], [place] [address], [place], [place] [address]   CK     Status: None   Collection Time: 05/19/20  5:04 AM  Result Value Ref Range   Total CK 76 38.0 - 234.0 U/L    Comment: Performed at [facility] [address], [place], [place] [address]  SARS Coronavirus 2 by RT PCR (hospital order, performed in [facility] hospital lab) Nasopharyngeal Nasopharyngeal Swab      Status: None   Collection Time: 05/19/20  6:50 AM   Specimen: Nasopharyngeal Swab  Result Value Ref Range   SARS Coronavirus 2 NEGATIVE NEGATIVE    Comment: (NOTE) SARS-CoV-2 target nucleic acids are NOT DETECTED.  The SARS-CoV-2 RNA is generally detectable in upper and lower respiratory specimens during the acute phase of infection. The lowest concentration of SARS-CoV-2 viral copies this assay can detect is 250 copies / mL. A negative result does not preclude SARS-CoV-2 infection and should not be used as the sole basis for treatment or other patient management decisions.  A negative result may occur with improper specimen collection / handling, submission of specimen other than nasopharyngeal swab, presence of viral mutation(s) within the areas targeted by this assay, and inadequate number of viral copies (<250 copies / mL). A negative result must be combined with clinical observations, patient history, and epidemiological information.  Fact Sheet for Patients:   StrictlyIdeas.no  Fact Sheet for Healthcare Providers: BankingDealers.co.za  This test is not yet approved or  cleared by the [place] FDA and has been authorized for detection and/or diagnosis of SARS-CoV-2 by FDA under an Emergency Use Authorization (EUA).  This EUA will remain in effect (meaning this test can be used) for the duration of the COVID-19 declaration under Section 564(b)(1) of the Act, 21 U.S.C. section 360bbb-3(b)(1), unless the authorization is terminated or revoked sooner.  Performed at [facility], [place] [address] [place], [place] [address]   Troponin I (High Sensitivity)     Status: Abnormal   Collection Time: 05/19/20  7:40 AM  Result Value Ref Range   Troponin I (High Sensitivity) 71 (H) <18 ng/L    Comment: RESULT CALLED TO, READ BACK BY AND VERIFIED WITH: A.STRADER,RN [id number] 05/19/20 CLARK,S (NOTE) Elevated high sensitivity troponin I  (hsTnI) values and significant  changes across serial measurements may suggest ACS but many other  chronic and acute conditions are known to elevate hsTnI results.  Refer to the Links section for chest pain algorithms and additional  guidance. Performed at [facility], [place] [address], [place], [place] [address]     CT KNEE LEFT WO CONTRAST  Result Date: 05/19/2020 CLINICAL DATA:  Left knee twisting injury today. Pain. Initial encounter. EXAM: CT OF THE LEFT KNEE WITHOUT CONTRAST TECHNIQUE: Multidetector CT imaging of the left knee  was performed according to the standard protocol. Multiplanar CT image reconstructions were also generated. COMPARISON:  Plain films left knee earlier today. FINDINGS: Bones/Joint/Cartilage As seen on the comparison plain films, the patient has a comminuted tibial plateau fracture. The medial plateau is divided into 2 main fragments at the articular surface. The fracture line is in the coronal plane extending through the junction of the anterior and posterior thirds of the medial plateau. The posterior fracture fragment is floating and depressed up to 0.5 cm. The floating posterior fragment is distracted up to 0.5 cm. This fragment measures approximately 4.3 cm transverse by 1.5 cm AP at the articular surface by 2.3 cm craniocaudal. The tibial eminences are comminuted and floating fragments. Depression of the lateral plateau measures up to approximately 0.8 cm posteriorly. No other fracture is identified. Bones are osteopenic. Minimal degenerative change is seen about the knee. Ligaments Suboptimally assessed by CT. Although poorly seen, the ACL and PCL appear intact but both attach to floating fracture fragments. The medial and lateral collateral ligament complex is also appear intact. Muscles and Tendons Intact. Soft tissues Joint effusion and chondrocalcinosis noted. IMPRESSION: Comminuted tibial plateau fracture as described above. A segment of the posterior aspect of  the plateau is a floating fragment and the tibial eminences are floating fragments. The PCL and ACL appear intact but both attached to floating fragments. Electronically Signed   By: Inge Rise M.D.   On: 05/19/2020 09:22   DG Chest Port 1 View  Result Date: 05/19/2020 CLINICAL DATA:  Post CPR. EXAM: PORTABLE CHEST 1 VIEW COMPARISON:  Chest x-ray report 10/02/2006. FINDINGS: Mediastinum and hilar structures normal. Cardiomegaly. No pulmonary venous congestion. Left base atelectasis/infiltrate. Moderate left pleural effusion. No pneumothorax. No displaced fracture noted. Right breast implant may be present. IMPRESSION: 1.  Cardiomegaly.  No pulmonary venous congestion. 2. Left base atelectasis/infiltrate. Moderate left pleural effusion. 3.  No evidence of displaced rib fracture or pneumothorax. Electronically Signed   By: Marcello Moores  Register   On: 05/19/2020 06:13   DG Knee Complete 4 Views Left  Result Date: 05/19/2020 CLINICAL DATA:  Patient slid out of bed twisting the left knee. EXAM: LEFT KNEE - COMPLETE 4+ VIEW COMPARISON:  None. FINDINGS: Diffuse bone demineralization. Comminuted fractures of the proximal tibia involving the lateral greater than medial tibial plateau. There is depression of fracture fragments with impaction along the fracture line. Soft tissue edema. IMPRESSION: Comminuted fractures of the proximal tibia involving the lateral greater than medial tibial plateau. Depression of the articular surface and impaction at the fracture line. Electronically Signed   By: Lucienne Capers M.D.   On: 05/19/2020 06:13   DG HIP UNILAT WITH PELVIS 2-3 VIEWS LEFT  Result Date: 05/19/2020 CLINICAL DATA:  Patient slid out of bed and twisted the left knee. 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 Two screws fix the acetabular component. Components appear well seated. No evidence of acute fracture or  dislocation. Degenerative changes are demonstrated in the lower lumbar spine and right hip. Pelvis and sacrum appear intact. IMPRESSION: Left total hip arthroplasty. Components appear well seated. No acute fracture or dislocation. Electronically Signed   By: Lucienne Capers M.D.   On: 05/19/2020 06:12    Review of Systems  HENT: Negative for ear discharge, ear pain, hearing loss and tinnitus.   Eyes: Negative for photophobia and pain.  Respiratory: Negative for cough and shortness of breath.  Cardiovascular: Negative for chest pain.  Gastrointestinal: Negative for abdominal pain, nausea and vomiting.  Genitourinary: Negative for dysuria, flank pain, frequency and urgency.  Musculoskeletal: Positive for arthralgias (Left knee). Negative for back pain, myalgias and neck pain.  Neurological: Negative for dizziness and headaches.  Hematological: Does not bruise/bleed easily.  Psychiatric/Behavioral: The patient is not nervous/anxious.    Blood pressure (!) 147/88, pulse 77, temperature (!) 97.5 F (36.4 C), temperature source Oral, resp. rate (!) 22, height 5' (1.524 m), SpO2 96 %. Physical Exam Constitutional:      General: She is not in acute distress.    Appearance: She is well-developed. She is not diaphoretic.  HENT:     Head: Normocephalic and atraumatic.  Eyes:     General: No scleral icterus.       Right eye: No discharge.        Left eye: No discharge.     Conjunctiva/sclera: Conjunctivae normal.  Cardiovascular:     Rate and Rhythm: Normal rate and regular rhythm.  Pulmonary:     Effort: Pulmonary effort is normal. No respiratory distress.  Musculoskeletal:     Cervical back: Normal range of motion.     Comments: LLE No traumatic wounds, ecchymosis, or rash  KI in place, mod TTP knee, mild TTP ankle  Mild knee and ankle effusions  Sens DPN, SPN, TN intact  Motor EHL, ext, flex, evers 5/5  DP 1+, PT 0, 2+ pitting edema  Skin:    General: Skin is warm and dry.   Neurological:     Mental Status: She is alert.  Psychiatric:        Behavior: Behavior normal.     Assessment/Plan: Left tibia plateau fx -- Our recommendation is for operative fixation at this point. Husband would like that to occur in a [facility] facility. Would contact Dr. Smitty Cords to arrange transfer or, alternatively, could discharge back to SNF with plans for OP f/u with Dr. Smitty Cords. She should continue KI at all times and NWB. Left ankle pain -- Obtain x-rays Multiple medical problems including MGUS; Crohn's; DM; and HTN -- per primary service    Lisette Abu, PA-C Orthopedic Surgery [phone] 05/19/2020, 9:35 AM

## 2020-05-19 NOTE — ED Triage Notes (Signed)
Per EMS, pt was at rehab facility [facility] in [place]" after having surgery on her Left leg.  Tonight she "slid" out of her bed and twisted her left knee, some deformities noted.  She has 10/10 pain, BP was 112/72, gave 61mcg of fentanyl to be able to splint the leg.  2 minutes later pt became unresponsive and lost pulses.  "Hands on only CPR" was preformed for 4 minutes, pulses returned and pt became instantly alert and oriented.    500MS given, pt remained alert and oriented  142/86 80HR 99% on non-rebreather

## 2020-05-19 NOTE — ED Notes (Signed)
Pt transported to CT ?

## 2020-05-19 NOTE — Progress Notes (Signed)
Orthopedic Tech Progress Note Patient Details:  Katherine Benson [birth year] [id number] Was called by RN to come and apply an [place] with TRAPEZE for patient. Husband was at bedside Patient ID: Katherine Benson, female   DOB: December 17, 1940, 79 y.o.   MRN: [id number]   [name] 05/19/2020, 10:21 AM

## 2020-05-19 NOTE — Progress Notes (Signed)
Orthopedic Tech Progress Note Patient Details:  Katherine Benson [birth year] [id number]  Ortho Devices Type of Ortho Device: Knee Immobilizer Ortho Device/Splint Location: Left Knee Ortho Device/Splint Interventions: Application, Adjustment   Post Interventions Patient Tolerated: Well Instructions Provided: Adjustment of device   Katherine Benson [name] 05/19/2020, 6:45 AM

## 2020-05-19 NOTE — ED Provider Notes (Addendum)
[facility] EMERGENCY DEPARTMENT Provider Note   CSN: [id number] Arrival date & time: 05/19/20  0449     History Chief Complaint  Patient presents with  . post CPR  . Fall    Katherine Benson is a 79 y.o. female.  Patient brought to the emergency department for evaluation after a fall.  Patient reportedly slid out of her bed at skilled nursing facility where she is [name] a lower extremity injury.  Patient did not hit her head or lose consciousness.  She was complaining of moderate to severe pain.  EMS arrived on the scene, established an IV and administered her IV fentanyl.  Immediately after administration of the fentanyl she became apneic and they were unable to find pulses.  Medic reports that they performed 4 minutes of CPR at which time she became awake again.  She was not on the monitor at time of unresponsiveness.        Past Medical History:  Diagnosis Date  . Hypertension     There are no problems to display for this patient.   History reviewed. No pertinent surgical history.   OB History   No obstetric history on file.     No family history on file.  Social History   Tobacco Use  . Smoking status: Never Smoker  . Smokeless tobacco: Never Used  Substance Use Topics  . Alcohol use: Not on file  . Drug use: Not on file    Home Medications Prior to Admission medications   Medication Sig Start Date End Date Taking? Authorizing Provider  Biotin (SUPER BIOTIN) 5 MG TABS Take by mouth.    [provider]  calcium carbonate (TUMS - DOSED IN MG ELEMENTAL CALCIUM) 500 MG chewable tablet Chew by mouth. 04/02/10   [provider]  denosumab (PROLIA) 60 MG/ML SOSY injection Inject into the skin.    [provider]  dicyclomine (BENTYL) 10 MG capsule  10/11/18   [provider]  Dicyclomine HCl (BENTYL PO) Take by mouth.    [provider]  docusate sodium (COLACE) 100 MG capsule Take by mouth.     [provider]  mupirocin ointment (BACTROBAN) 2 % Apply 1 application topically 2 (two) times daily. 12/22/18   Kandra Nicolas, MD  nitrofurantoin (MACRODANTIN) 50 MG capsule Take one cap PO Q6hr for 5 days. 09/15/19   Kandra Nicolas, MD  Omeprazole (PRILOSEC PO) Take by mouth.    [provider]  omeprazole (PRILOSEC) 40 MG capsule TAKE ONE CAPSULE (40 MG DOSE) BY MOUTH DAILY. 11/01/18   [provider]  triamcinolone (NASACORT) 55 MCG/ACT AERO nasal inhaler Place into the nose.    [provider]    Allergies    Penicillins, Sulfa antibiotics, and Erythromycin  Review of Systems   Review of Systems  Musculoskeletal: Positive for arthralgias.  Neurological: Positive for syncope.  All other systems reviewed and are negative.   Physical Exam Updated Vital Signs BP 127/77   Pulse 81   Temp (!) 97.5 F (36.4 C) (Oral)   Resp (!) 23   Ht 5' (1.524 m)   SpO2 91%   BMI 29.88 kg/m   Physical Exam Vitals and nursing note reviewed.  Constitutional:      General: She is not in acute distress.    Appearance: Normal appearance. She is well-developed.  HENT:     Head: Normocephalic and atraumatic.     Right Ear: Hearing normal.  Left Ear: Hearing normal.     Nose: Nose normal.  Eyes:     Conjunctiva/sclera: Conjunctivae normal.     Pupils: Pupils are equal, round, and reactive to light.  Cardiovascular:     Rate and Rhythm: Regular rhythm.     Heart sounds: S1 normal and S2 normal. No murmur heard.  No friction rub. No gallop.   Pulmonary:     Effort: Pulmonary effort is normal. No respiratory distress.     Breath sounds: Normal breath sounds.  Chest:     Chest wall: No tenderness.  Abdominal:     General: Bowel sounds are normal.     Palpations: Abdomen is soft.     Tenderness: There is no abdominal tenderness. There is no guarding or rebound. Negative signs include Murphy's sign and McBurney's sign.     Hernia: No hernia is  present.  Musculoskeletal:     Cervical back: Normal range of motion and neck supple.     Left knee: Decreased range of motion. Tenderness present.  Skin:    General: Skin is warm and dry.     Findings: No rash.  Neurological:     Mental Status: She is alert and oriented to person, place, and time.     GCS: GCS eye subscore is 4. GCS verbal subscore is 5. GCS motor subscore is 6.     Cranial Nerves: No cranial nerve deficit.     Sensory: No sensory deficit.     Coordination: Coordination normal.  Psychiatric:        Speech: Speech normal.        Behavior: Behavior normal.        Thought Content: Thought content normal.     ED Results / Procedures / Treatments   Labs (all labs ordered are listed, but only abnormal results are displayed) Labs Reviewed  CBC WITH DIFFERENTIAL/PLATELET - Abnormal; Notable for the following components:      Result Value   WBC 16.2 (*)    RBC 3.53 (*)    Hemoglobin 10.9 (*)    HCT 32.1 (*)    Neutro Abs 15.0 (*)    Lymphs Abs 0.5 (*)    Abs Immature Granulocytes 0.17 (*)    All other components within normal limits  BASIC METABOLIC PANEL - Abnormal; Notable for the following components:   Sodium 126 (*)    Potassium 3.4 (*)    Chloride 89 (*)    Glucose, Bld 147 (*)    Calcium 8.7 (*)    All other components within normal limits  TROPONIN I (HIGH SENSITIVITY) - Abnormal; Notable for the following components:   Troponin I (High Sensitivity) 22 (*)    All other components within normal limits  CK    EKG EKG Interpretation  Date/Time:  Tuesday May 19 2020 04:55:03 EDT Ventricular Rate:  80 PR Interval:    QRS Duration: 152 QT Interval:  423 QTC Calculation: 488 R Axis:   79 Text Interpretation: Sinus rhythm Right bundle branch block No previous tracing Confirmed by Orpah Greek [phone]) on 05/19/2020 5:11:56 AM   Radiology No results found.  Procedures Procedures (including critical care time)  Medications Ordered  in ED Medications - No data to display  ED Course  I have reviewed the triage vital signs and the nursing notes.  Pertinent labs & imaging results that were available during my care of the patient were reviewed by me and considered in my medical decision making (see chart  for details).    MDM Rules/Calculators/A&P                          Patient brought to the emergency department for evaluation after a fall.  Patient had a fall from bed at skilled nursing facility where she is currently admitted for rehab after suffering a left tibial plateau fracture.  Patient was complaining of severe left knee pain after the fall.  She was administered 50 mcg of fentanyl at which point she became apneic.  EMS report that they were unable to find pulses and initiated CPR.  She was not on the monitor at this time so rhythm was unknown.  Patient received CPR for 4 minutes at which point she became alert.  I suspect that this was a reaction to the fentanyl.  Her husband and her son, her critical care nurse, confirm that she has had significant sensitivity to medications in the past.  X-ray of knee shows lateral and medial tibial plateau fracture that sounds identical to the CT of knee from 7/22 performed at [facility].  She therefore does not require any orthopedic intervention at this time, can follow-up with her orthopedic surgeon for repeat evaluation.  Hip does not show any abnormality.  Because of the questionable arrest, will arrange for observation admission.  Final Clinical Impression(s) / ED Diagnoses Final diagnoses:  Fall, initial encounter  Closed fracture of medial portion of left tibial plateau with routine healing, subsequent encounter    Rx / DC Orders ED Discharge Orders    None       Sherill Wegener, Gwenyth Allegra, MD 05/19/20 [id number]    Orpah Greek, MD 05/19/20 [phone]

## 2020-05-19 NOTE — ED Notes (Signed)
Pt placed on hospital bed

## 2020-05-19 NOTE — H&P (View-Only) (Signed)
Reason for Consult:Left tibia plateau fx Referring Physician: [name] is an 79 y.o. female.  HPI: Katherine Benson fell about 3 weeks ago and suffered a tibia plateau fx to her left leg. She was hospitalized in [place] for about a week and then discharged to SNF for rehab. She was seen by Dr. Smitty Benson with orthopedic who advised non-operative treatment. Last night it appears she fell out of bed or tried to get up and fell again as staff found her on floor beside bed early this morning. The pt seems confused and husband provided most of history though this is not her baseline. Prior to the first fall she was ambulating and driving and rarely used a cane.  Past Medical History:  Diagnosis Date  . Hypertension     History reviewed. No pertinent surgical history.  History reviewed. No pertinent family history.  Social History:  reports that she has never smoked. She has never used smokeless tobacco. She reports that she does not drink alcohol and does not use drugs.  Allergies:  Allergies  Allergen Reactions  . Cefaclor Rash  . Mercaptopurine Other (See Comments)    Elevated liver tests Elevated liver tests Elevated liver tests   . Penicillins Rash  . Sulfa Antibiotics Rash  . Erythromycin     Medications: I have reviewed the patient's current medications.  Results for orders placed or performed during the hospital encounter of 05/19/20 (from the past 48 hour(s))  CBC with Differential/Platelet     Status: Abnormal   Collection Time: 05/19/20  5:04 AM  Result Value Ref Range   WBC 16.2 (H) 4.0 - 10.5 K/uL   RBC 3.53 (L) 3.87 - 5.11 MIL/uL   Hemoglobin 10.9 (L) 12.0 - 15.0 g/dL   HCT 32.1 (L) 36 - 46 %   MCV 90.9 80.0 - 100.0 fL   MCH 30.9 26.0 - 34.0 pg   MCHC 34.0 30.0 - 36.0 g/dL   RDW 12.8 11.5 - 15.5 %   Platelets 313 150 - 400 K/uL   nRBC 0.0 0.0 - 0.2 %   Neutrophils Relative % 93 %   Neutro Abs 15.0 (H) 1.7 - 7.7 K/uL   Lymphocytes Relative 3 %   Lymphs  Abs 0.5 (L) 0.7 - 4.0 K/uL   Monocytes Relative 3 %   Monocytes Absolute 0.5 0 - 1 K/uL   Eosinophils Relative 0 %   Eosinophils Absolute 0.0 0 - 0 K/uL   Basophils Relative 0 %   Basophils Absolute 0.0 0 - 0 K/uL   Immature Granulocytes 1 %   Abs Immature Granulocytes 0.17 (H) 0.00 - 0.07 K/uL    Comment: Performed at [facility], [address] [place], [place] [address]  Basic metabolic panel     Status: Abnormal   Collection Time: 05/19/20  5:04 AM  Result Value Ref Range   Sodium 126 (L) 135 - 145 mmol/L   Potassium 3.4 (L) 3.5 - 5.1 mmol/L   Chloride 89 (L) 98 - 111 mmol/L   CO2 27 22 - 32 mmol/L   Glucose, Bld 147 (H) 70 - 99 mg/dL    Comment: Glucose reference range applies only to samples taken after fasting for at least 8 hours.   BUN 8 8 - 23 mg/dL   Creatinine, Ser 0.68 0.44 - 1.00 mg/dL   Calcium 8.7 (L) 8.9 - 10.3 mg/dL   GFR calc non Af Amer >60 >60 mL/min   GFR calc Af Amer >  60 >60 mL/min   Anion gap 10 5 - 15    Comment: Performed at [facility] [address] [place], [place] [address]  Troponin I (High Sensitivity)     Status: Abnormal   Collection Time: 05/19/20  5:04 AM  Result Value Ref Range   Troponin I (High Sensitivity) 22 (H) <18 ng/L    Comment: (NOTE) Elevated high sensitivity troponin I (hsTnI) values and significant  changes across serial measurements may suggest ACS but many other  chronic and acute conditions are known to elevate hsTnI results.  Refer to the "Links" section for chest pain algorithms and additional  guidance. Performed at [facility], [place] [address], [place], [place] [address]   CK     Status: None   Collection Time: 05/19/20  5:04 AM  Result Value Ref Range   Total CK 76 38.0 - 234.0 U/L    Comment: Performed at [facility] [address], [place], [place] [address]  SARS Coronavirus 2 by RT PCR (hospital order, performed in [facility] hospital lab) Nasopharyngeal Nasopharyngeal Swab      Status: None   Collection Time: 05/19/20  6:50 AM   Specimen: Nasopharyngeal Swab  Result Value Ref Range   SARS Coronavirus 2 NEGATIVE NEGATIVE    Comment: (NOTE) SARS-CoV-2 target nucleic acids are NOT DETECTED.  The SARS-CoV-2 RNA is generally detectable in upper and lower respiratory specimens during the acute phase of infection. The lowest concentration of SARS-CoV-2 viral copies this assay can detect is 250 copies / mL. A negative result does not preclude SARS-CoV-2 infection and should not be used as the sole basis for treatment or other patient management decisions.  A negative result may occur with improper specimen collection / handling, submission of specimen other than nasopharyngeal swab, presence of viral mutation(s) within the areas targeted by this assay, and inadequate number of viral copies (<250 copies / mL). A negative result must be combined with clinical observations, patient history, and epidemiological information.  Fact Sheet for Patients:   StrictlyIdeas.no  Fact Sheet for Healthcare Providers: BankingDealers.co.za  This test is not yet approved or  cleared by the [place] FDA and has been authorized for detection and/or diagnosis of SARS-CoV-2 by FDA under an Emergency Use Authorization (EUA).  This EUA will remain in effect (meaning this test can be used) for the duration of the COVID-19 declaration under Section 564(b)(1) of the Act, 21 U.S.C. section 360bbb-3(b)(1), unless the authorization is terminated or revoked sooner.  Performed at [facility], [place] [address] [place], [place] [address]   Troponin I (High Sensitivity)     Status: Abnormal   Collection Time: 05/19/20  7:40 AM  Result Value Ref Range   Troponin I (High Sensitivity) 71 (H) <18 ng/L    Comment: RESULT CALLED TO, READ BACK BY AND VERIFIED WITH: A.STRADER,RN [id number] 05/19/20 CLARK,S (NOTE) Elevated high sensitivity troponin I  (hsTnI) values and significant  changes across serial measurements may suggest ACS but many other  chronic and acute conditions are known to elevate hsTnI results.  Refer to the Links section for chest pain algorithms and additional  guidance. Performed at [facility], [place] [address], [place],  [address]     CT KNEE LEFT WO CONTRAST  Result Date: 05/19/2020 CLINICAL DATA:  Left knee twisting injury today. Pain. Initial encounter. EXAM: CT OF THE LEFT KNEE WITHOUT CONTRAST TECHNIQUE: Multidetector CT imaging of the left knee  was performed according to the standard protocol. Multiplanar CT image reconstructions were also generated. COMPARISON:  Plain films left knee earlier today. FINDINGS: Bones/Joint/Cartilage As seen on the comparison plain films, the patient has a comminuted tibial plateau fracture. The medial plateau is divided into 2 main fragments at the articular surface. The fracture line is in the coronal plane extending through the junction of the anterior and posterior thirds of the medial plateau. The posterior fracture fragment is floating and depressed up to 0.5 cm. The floating posterior fragment is distracted up to 0.5 cm. This fragment measures approximately 4.3 cm transverse by 1.5 cm AP at the articular surface by 2.3 cm craniocaudal. The tibial eminences are comminuted and floating fragments. Depression of the lateral plateau measures up to approximately 0.8 cm posteriorly. No other fracture is identified. Bones are osteopenic. Minimal degenerative change is seen about the knee. Ligaments Suboptimally assessed by CT. Although poorly seen, the ACL and PCL appear intact but both attach to floating fracture fragments. The medial and lateral collateral ligament complex is also appear intact. Muscles and Tendons Intact. Soft tissues Joint effusion and chondrocalcinosis noted. IMPRESSION: Comminuted tibial plateau fracture as described above. A segment of the posterior aspect of  the plateau is a floating fragment and the tibial eminences are floating fragments. The PCL and ACL appear intact but both attached to floating fragments. Electronically Signed   By: Inge Rise M.D.   On: 05/19/2020 09:22   DG Chest Port 1 View  Result Date: 05/19/2020 CLINICAL DATA:  Post CPR. EXAM: PORTABLE CHEST 1 VIEW COMPARISON:  Chest x-ray report 10/02/2006. FINDINGS: Mediastinum and hilar structures normal. Cardiomegaly. No pulmonary venous congestion. Left base atelectasis/infiltrate. Moderate left pleural effusion. No pneumothorax. No displaced fracture noted. Right breast implant may be present. IMPRESSION: 1.  Cardiomegaly.  No pulmonary venous congestion. 2. Left base atelectasis/infiltrate. Moderate left pleural effusion. 3.  No evidence of displaced rib fracture or pneumothorax. Electronically Signed   By: Marcello Moores  Register   On: 05/19/2020 06:13   DG Knee Complete 4 Views Left  Result Date: 05/19/2020 CLINICAL DATA:  Patient slid out of bed twisting the left knee. EXAM: LEFT KNEE - COMPLETE 4+ VIEW COMPARISON:  None. FINDINGS: Diffuse bone demineralization. Comminuted fractures of the proximal tibia involving the lateral greater than medial tibial plateau. There is depression of fracture fragments with impaction along the fracture line. Soft tissue edema. IMPRESSION: Comminuted fractures of the proximal tibia involving the lateral greater than medial tibial plateau. Depression of the articular surface and impaction at the fracture line. Electronically Signed   By: Lucienne Capers M.D.   On: 05/19/2020 06:13   DG HIP UNILAT WITH PELVIS 2-3 VIEWS LEFT  Result Date: 05/19/2020 CLINICAL DATA:  Patient slid out of bed and twisted the left knee. 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 Two screws fix the acetabular component. Components appear well seated. No evidence of acute fracture or  dislocation. Degenerative changes are demonstrated in the lower lumbar spine and right hip. Pelvis and sacrum appear intact. IMPRESSION: Left total hip arthroplasty. Components appear well seated. No acute fracture or dislocation. Electronically Signed   By: Lucienne Capers M.D.   On: 05/19/2020 06:12    Review of Systems  HENT: Negative for ear discharge, ear pain, hearing loss and tinnitus.   Eyes: Negative for photophobia and pain.  Respiratory: Negative for cough and shortness of breath.  Cardiovascular: Negative for chest pain.  Gastrointestinal: Negative for abdominal pain, nausea and vomiting.  Genitourinary: Negative for dysuria, flank pain, frequency and urgency.  Musculoskeletal: Positive for arthralgias (Left knee). Negative for back pain, myalgias and neck pain.  Neurological: Negative for dizziness and headaches.  Hematological: Does not bruise/bleed easily.  Psychiatric/Behavioral: The patient is not nervous/anxious.    Blood pressure (!) 147/88, pulse 77, temperature (!) 97.5 F (36.4 C), temperature source Oral, resp. rate (!) 22, height 5' (1.524 m), SpO2 96 %. Physical Exam Constitutional:      General: She is not in acute distress.    Appearance: She is well-developed. She is not diaphoretic.  HENT:     Head: Normocephalic and atraumatic.  Eyes:     General: No scleral icterus.       Right eye: No discharge.        Left eye: No discharge.     Conjunctiva/sclera: Conjunctivae normal.  Cardiovascular:     Rate and Rhythm: Normal rate and regular rhythm.  Pulmonary:     Effort: Pulmonary effort is normal. No respiratory distress.  Musculoskeletal:     Cervical back: Normal range of motion.     Comments: LLE No traumatic wounds, ecchymosis, or rash  KI in place, mod TTP knee, mild TTP ankle  Mild knee and ankle effusions  Sens DPN, SPN, TN intact  Motor EHL, ext, flex, evers 5/5  DP 1+, PT 0, 2+ pitting edema  Skin:    General: Skin is warm and dry.   Neurological:     Mental Status: She is alert.  Psychiatric:        Behavior: Behavior normal.     Assessment/Plan: Left tibia plateau fx -- Our recommendation is for operative fixation at this point. Husband would like that to occur in a [facility] facility. Would contact Dr. Smitty Benson to arrange transfer or, alternatively, could discharge back to SNF with plans for OP f/u with Dr. Smitty Benson. She should continue KI at all times and NWB. Left ankle pain -- Obtain x-rays Multiple medical problems including MGUS; Crohn's; DM; and HTN -- per primary service    Lisette Abu, PA-C Orthopedic Surgery [phone] 05/19/2020, 9:35 AM

## 2020-05-19 NOTE — TOC Initial Note (Signed)
Transition of Care [facility]) - Initial/Assessment Note    Patient Details  Name: Katherine Benson MRN: [id number] Date of Birth: 07-Nov-1940  [facility]) CM/SW Contact:    Alexander Mt, LCSW Phone Number:  05/19/2020, 3:34 PM  Clinical Narrative:                 CSW spoke with pt husband Katherine Benson via telephone, [phone]. Introduced self, role, reason for call. Confirmed pt from [facility], she has been there for short term rehab following a stay at [facility]. Pt husband aware CSW /TOC team will f/u with pt and family once disposition more clear. CSW will send through information to [employer] on the hub and monitor chart. Pt spouse has my contact information for any additional questions.    Barriers to Discharge: Continued Medical Work up, Ship broker   Patient Goals and CMS Choice CMS Medicare.gov Compare Post Acute Care list provided to:: Patient Represenative (must comment) (pt husband Katherine Benson) Choice offered to / list presented to : Spouse  Expected Discharge Plan and Services In-house Referral: Clinical Social Work Discharge Planning Services: CM Consult Post Acute Care Choice: [place], Resumption of Svcs/PTA Provider Living arrangements for the past 2 months: Single Family Home, [place]                  Prior Living Arrangements/Services Living arrangements for the past 2 months: [place], [place] Lives with:: Spouse Patient language and need for interpreter reviewed:: Yes (no needs) Do you feel safe going back to the place where you live?: Yes      Need for Family Participation in Patient Care: Yes (Comment) (assistance w/ daily cares as needed) Care giver support system in place?: Yes (comment) (spouse; adult children) Current home services: DME Criminal Activity/Legal Involvement Pertinent to Current Situation/Hospitalization: No - Comment as needed   Permission Sought/Granted Permission  sought to share information with : Family Supports Permission granted to share information with : No (pt with fluctuating orientation)  Share Information with NAME: Katherine Benson  Permission granted to share info w AGENCY: [place] granted to share info w Relationship: spouse  Permission granted to share info w Contact Information: [phone]  Emotional Assessment Appearance:: Other (Comment Required (telephonic assessment w/ pt spouse) Attitude/Demeanor/Rapport: Other (comment) (telephonic assessment w/ pt husband) Affect (typically observed): Other (comment) (telephonic assessment w/ pt husband) Orientation: : Oriented to Self, Oriented to Place, Fluctuating Orientation (Suspected and/or reported Sundowners) Alcohol / Substance Use: Not Applicable Psych Involvement: No (comment)  Admission diagnosis:  Ankle pain [M25.579] Fall [W19.XXXA] Unresponsive episode [R41.89] Fall, initial encounter [id number]] Closed fracture of medial portion of left tibial plateau with routine healing, subsequent encounter [S82.132D] Patient Active Problem List   Diagnosis Date Noted  . Unresponsive episode 05/19/2020   PCP:  Heber [place], MD Pharmacy:   [id number] - [place], [place] [address] [place] [address] Phone: [phone] Fax: [phone]  Readmission Risk Interventions No flowsheet data found.

## 2020-05-19 NOTE — H&P (Signed)
History and Physical    Katherine Benson WCH:[id number] DOB: 11/22/1940 DOA: 05/19/2020  PCP: Heber [place], MD Consultants:  [place] - orthopedics; [place] - oncology; [place] - GI Patient coming from:  Home - lives with husband (but patient is currently in rehab); NOK: [place], [place], [phone]  Chief Complaint: Fall  HPI: Katherine Benson is a 79 y.o. female with medical history significant of MGUS; Crohn's; DM; and HTN who was recently hospitalized in [place] for a L tibial plateau fracture and discharged to rehab.  She had another fall at rehab overnight.  She doesn't know what happened.  She "must have" fallen.  She feels "kind of yucky" now.  C/o back pain.  She feels like she needs to use the bathroom.  Her husband reports that she fell about 3 weeks ago with a tibial plateau fracture.  She has been at [facility] for rehab.  This AM about 0300, they found her sitting in the floor next to the bed.  She tends to have an extreme reaction to medication, they gave her a lot of Fentanyl and it led to a brief episode of unresponsiveness, possible cardiac arrest.  Their son is a critical care nurse for [employer].    ED Course:  Carryover, per Dr. Hal Hope:  79 year old female who had a recent knee injury is in rehab had a fall again and EMS was called. Patient had a pain in the knee which was injured recently EMS gave fentanyl following which patient became unresponsive had to be given few CPR/chest compressions and patient was brought to the ER. In the ER patient is mildly lethargic but otherwise nonfocal and admitted for further observation.   Review of Systems: As per HPI; otherwise review of systems reviewed and negative.   Ambulatory Status:  Ambulates without assistance at baseline, currently in rehab  COVID Vaccine Status:  Complete  Past Medical History:  Diagnosis Date  . Crohn disease ([place])   . Diabetes mellitus type 2 in obese ([place])   . Hypertension   . MGUS  (monoclonal gammopathy of unknown significance)     History reviewed. No pertinent surgical history.  Social History   Socioeconomic History  . Marital status: Married    Spouse name: Not on file  . Number of children: Not on file  . Years of education: Not on file  . Highest education level: Not on file  Occupational History  . Occupation: retired  Tobacco Use  . Smoking status: Never Smoker  . Smokeless tobacco: Never Used  Substance and Sexual Activity  . Alcohol use: Never  . Drug use: Never  . Sexual activity: Not on file  Other Topics Concern  . Not on file  Social History Narrative  . Not on file   Social Determinants of Health   Financial Resource Strain:   . Difficulty of Paying Living Expenses: Not on file  Food Insecurity:   . Worried About Charity fundraiser in the Last Year: Not on file  . Ran Out of Food in the Last Year: Not on file  Transportation Needs:   . Lack of Transportation (Medical): Not on file  . Lack of Transportation (Non-Medical): Not on file  Physical Activity:   . Days of Exercise per Week: Not on file  . Minutes of Exercise per Session: Not on file  Stress:   . Feeling of Stress : Not on file  Social Connections:   . Frequency of Communication with Friends and Family: Not on file  .  Frequency of Social Gatherings with Friends and Family: Not on file  . Attends Religious Services: Not on file  . Active Member of Clubs or Organizations: Not on file  . Attends Archivist Meetings: Not on file  . Marital Status: Not on file  Intimate Partner Violence:   . Fear of Current or Ex-Partner: Not on file  . Emotionally Abused: Not on file  . Physically Abused: Not on file  . Sexually Abused: Not on file    Allergies  Allergen Reactions  . Cefaclor Rash  . Mercaptopurine Other (See Comments)    Elevated liver tests Elevated liver tests Elevated liver tests   . Penicillins Rash  . Sulfa Antibiotics Rash  . Erythromycin       History reviewed. No pertinent family history.  Prior to Admission medications   Medication Sig Start Date End Date Taking? Authorizing Provider  Biotin (SUPER BIOTIN) 5 MG TABS Take by mouth.    [provider]  calcium carbonate (TUMS - DOSED IN MG ELEMENTAL CALCIUM) 500 MG chewable tablet Chew by mouth. 04/02/10   [provider]  denosumab (PROLIA) 60 MG/ML SOSY injection Inject into the skin.    [provider]  dicyclomine (BENTYL) 10 MG capsule  10/11/18   [provider]  Dicyclomine HCl (BENTYL PO) Take by mouth.    [provider]  docusate sodium (COLACE) 100 MG capsule Take by mouth.    [provider]  mupirocin ointment (BACTROBAN) 2 % Apply 1 application topically 2 (two) times daily. 12/22/18   Kandra Nicolas, MD  nitrofurantoin (MACRODANTIN) 50 MG capsule Take one cap PO Q6hr for 5 days. 09/15/19   Kandra Nicolas, MD  Omeprazole (PRILOSEC PO) Take by mouth.    [provider]  omeprazole (PRILOSEC) 40 MG capsule TAKE ONE CAPSULE (40 MG DOSE) BY MOUTH DAILY. 11/01/18   [provider]  triamcinolone (NASACORT) 55 MCG/ACT AERO nasal inhaler Place into the nose.    [provider]    Physical Exam: Vitals:   05/19/20 1330 05/19/20 1400 05/19/20 1430 05/19/20 1556  BP: (!) 151/95 131/83 123/80 (!) 164/88  Pulse: 81 73 76 83  Resp: (!) 22 18 16  (!) 24  Temp:    99.5 F (37.5 C)  TempSrc:    Oral  SpO2: 100% 100% 100% 98%  Height:         . General:  Appears somnolent but is in NAD . Eyes:   EOMI, normal lids, iris . ENT:  grossly normal hearing, lips & tongue, mmm . Neck:  no LAD, masses or thyromegaly . Cardiovascular:  RRR, no m/r/g. No LE edema.  Marland Kitchen Respiratory:   CTA bilaterally with no wheezes/rales/rhonchi.  Normal respiratory effort. . Abdomen:  soft, NT, ND, NABS . Skin:  no rash or induration seen on limited exam . Musculoskeletal:  L knee immobilizer in place; left foot  edema with 1+ distal pulses (present with cool foot but not cold or apparently ischemic) . Psychiatric:  blunted mood and affect, speech sparse but appropriate, AOx3 . Neurologic:  CN 2-12 grossly intact, moves all extremities in coordinated fashion other than LLE    Radiological Exams on Admission: DG Ankle Complete Left  Result Date: 05/19/2020 CLINICAL DATA:  Lateral left ankle pain after a fall this morning. Initial encounter. EXAM: LEFT ANKLE COMPLETE - 3+ VIEW COMPARISON:  None. FINDINGS: No acute bony or joint abnormality is identified. Soft tissues about the ankle appear diffusely  swollen. No tibiotalar joint effusion. No focal bony lesion. IMPRESSION: Negative for acute bony or joint abnormality. Diffuse soft tissue swelling about the ankle could be posttraumatic but may also be secondary to dependent change. Electronically Signed   By: Inge Rise M.D.   On: 05/19/2020 10:23   CT KNEE LEFT WO CONTRAST  Result Date: 05/19/2020 CLINICAL DATA:  Left knee twisting injury today. Pain. Initial encounter. EXAM: CT OF THE LEFT KNEE WITHOUT CONTRAST TECHNIQUE: Multidetector CT imaging of the left knee was performed according to the standard protocol. Multiplanar CT image reconstructions were also generated. COMPARISON:  Plain films left knee earlier today. FINDINGS: Bones/Joint/Cartilage As seen on the comparison plain films, the patient has a comminuted tibial plateau fracture. The medial plateau is divided into 2 main fragments at the articular surface. The fracture line is in the coronal plane extending through the junction of the anterior and posterior thirds of the medial plateau. The posterior fracture fragment is floating and depressed up to 0.5 cm. The floating posterior fragment is distracted up to 0.5 cm. This fragment measures approximately 4.3 cm transverse by 1.5 cm AP at the articular surface by 2.3 cm craniocaudal. The tibial eminences are comminuted and floating fragments. Depression  of the lateral plateau measures up to approximately 0.8 cm posteriorly. No other fracture is identified. Bones are osteopenic. Minimal degenerative change is seen about the knee. Ligaments Suboptimally assessed by CT. Although poorly seen, the ACL and PCL appear intact but both attach to floating fracture fragments. The medial and lateral collateral ligament complex is also appear intact. Muscles and Tendons Intact. Soft tissues Joint effusion and chondrocalcinosis noted. IMPRESSION: Comminuted tibial plateau fracture as described above. A segment of the posterior aspect of the plateau is a floating fragment and the tibial eminences are floating fragments. The PCL and ACL appear intact but both attached to floating fragments. Electronically Signed   By: Inge Rise M.D.   On: 05/19/2020 09:22   DG Chest Port 1 View  Result Date: 05/19/2020 CLINICAL DATA:  Post CPR. EXAM: PORTABLE CHEST 1 VIEW COMPARISON:  Chest x-ray report 10/02/2006. FINDINGS: Mediastinum and hilar structures normal. Cardiomegaly. No pulmonary venous congestion. Left base atelectasis/infiltrate. Moderate left pleural effusion. No pneumothorax. No displaced fracture noted. Right breast implant may be present. IMPRESSION: 1.  Cardiomegaly.  No pulmonary venous congestion. 2. Left base atelectasis/infiltrate. Moderate left pleural effusion. 3.  No evidence of displaced rib fracture or pneumothorax. Electronically Signed   By: Marcello Moores  Register   On: 05/19/2020 06:13   DG Knee Complete 4 Views Left  Result Date: 05/19/2020 CLINICAL DATA:  Patient slid out of bed twisting the left knee. EXAM: LEFT KNEE - COMPLETE 4+ VIEW COMPARISON:  None. FINDINGS: Diffuse bone demineralization. Comminuted fractures of the proximal tibia involving the lateral greater than medial tibial plateau. There is depression of fracture fragments with impaction along the fracture line. Soft tissue edema. IMPRESSION: Comminuted fractures of the proximal tibia involving  the lateral greater than medial tibial plateau. Depression of the articular surface and impaction at the fracture line. Electronically Signed   By: Lucienne Capers M.D.   On: 05/19/2020 06:13   DG HIP UNILAT WITH PELVIS 2-3 VIEWS LEFT  Result Date: 05/19/2020 CLINICAL DATA:  Patient slid out of bed and twisted the left knee. 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 Two screws fix the acetabular component. Components appear well seated. No  evidence of acute fracture or dislocation. Degenerative changes are demonstrated in the lower lumbar spine and right hip. Pelvis and sacrum appear intact. IMPRESSION: Left total hip arthroplasty. Components appear well seated. No acute fracture or dislocation. Electronically Signed   By: Lucienne Capers M.D.   On: 05/19/2020 06:12    EKG: Independently reviewed.  NSR with rate 80; RBBB with no evidence of acute ischemia   Labs on Admission: I have personally reviewed the available labs and imaging studies at the time of the admission.  Pertinent labs:   Na++ 126 K+ 3.4 Glucose 147 CK 76 HS troponin 22, 71 WBC 16.2 Hgb 10.9 COVID negative   Assessment/Plan Principal Problem:   Fall at home, initial encounter Active Problems:   Unresponsive episode   Hypertension   MGUS (monoclonal gammopathy of unknown significance)   Crohn disease ([place])   Diabetes mellitus type 2 in obese ([facility])   Tibial plateau fracture, left, closed, with delayed healing, subsequent encounter   Acute hyponatremia   Fall -Patient with prior hospitalization in July, tibial plateau fracture -She was treated with a knee immobilizer with plan for [facility]AT and SNF rehab -While at SNF, she apparently fell overnight and was found sitting next to the bed -No obvious injuries other than significant MSK pain (back, knee, ankle) -Will observe for now pending additional treatment  plan  Tibial plateau fracture -Prior xrays with healing stable mildly comminuted and depressed lateral tibial plateau fracture -Initial xray from today was reviewed and concerning - comminuted fractures of the proximal tibia involving the lateral > medial tibial plateau; depression of the articular surface and impaction at the fracture line -CT performed and shows comminuted tibial plateau fracture with a segment of the posterior aspect of the plateau as a floating fragment and the tibial eminences are floating fragment, with PCL and ACL intact but both attached to floating fragments -This is clearly now an unstable fracture and appears to require operative repair -Orthopedics consulted - patient's husband may prefer surgery at [place] so will need ongoing discussion -For now, needs knee immobilizer and non-weight-bearing status at all times  Unresponsive episode -With EMS, patient was given IV Fentanyl -Husband reports exquisite sensitivity to medications -She became unresponsive and there was question about pulselessness/apnea -EMS began 4 minutes of CPR with resolution of consciousness -Her troponin was normal on presentation and uptrended, likely from compressions -Will trend troponin to peak but anticipate resolution without need for intervention at this time -Continue ASA  Hyponatremia -Uncertain etiology, possible mild volume depletion vs. Tegretol -Will give 1L LR over 2 hours -Will recheck BMP in AM  HTN -Continue Diltiazem  MGUS -Last seen in clinic on 11/05/19 -Surveillance only  Crohn's Disease -Declines medications for this issue -On Omeprazole for GERD and Bentyl for IBS -Last seen by Gi on 09/25/19  DM -Diet controlled -Last seen by endocrinology on 12/06/19  Other -Continue Tegretol, Lexapro - possibly associated with depression and cognitive impairment but no recent obvious notes about why she is taking these medications    Note: This patient has been tested  and is negative for the novel coronavirus COVID-19.  DVT prophylaxis:  SCDs Code Status:  Full - confirmed with family Family Communication: Husband was present throughout evaluation Disposition Plan:  The patient is from: SNF [facility]  Anticipated d/c is to: SNF [facility]  Anticipated d/c date will depend on clinical response to treatment, but possibly as early as tomorrow if surgery here is declined  Patient is currently: acutely ill Consults called: Orthopedics; [place] team Admission status:  It is my clinical opinion that referral for OBSERVATION is reasonable and necessary in this patient based on the above information provided. The aforementioned taken together are felt to place the patient at high risk for further clinical deterioration. However it is anticipated that the patient may be medically stable for discharge from the hospital within 24 to 48 hours.    Karmen Bongo MD [facility]   How to contact the [facility] Attending or Consulting provider [place] or covering provider during after hours [place], for this patient?  1. Check the care team in [facility] and look for a) attending/consulting TRH provider listed and b) the [facility] team listed 2. Log into [email or web address] and use [place] universal password to access. If you do not have the password, please contact the hospital operator. 3. Locate the [facility] provider you are looking for under [facility] and page to a number that you can be directly reached. 4. If you still have difficulty reaching the provider, please page the [facility] (Director on Call) for the Hospitalists listed on amion for assistance.   05/19/2020, 4:21 PM

## 2020-05-19 NOTE — ED Notes (Signed)
hospitalist at bedside

## 2020-05-19 NOTE — Progress Notes (Signed)
Lab called with "alert" of Troponin 48. Dr. Hal Hope notified.

## 2020-05-19 NOTE — ED Notes (Addendum)
Pt's O2 has been slowly decreased to .5lt [place] from 4.  Pt is maintaining an O2 of 99%

## 2020-05-19 NOTE — ED Notes (Signed)
Ortho at bedside to place overhead trapeze bar

## 2020-05-19 NOTE — ED Notes (Signed)
Attending [place] MD made aware of tropin lab trend.

## 2020-05-19 NOTE — ED Notes (Signed)
Othro coming to apply knee immobilizer

## 2020-05-19 NOTE — ED Notes (Signed)
Lab will run CK

## 2020-05-20 ENCOUNTER — Observation Stay (HOSPITAL_COMMUNITY): Payer: Medicare HMO

## 2020-05-20 DIAGNOSIS — K50919 Crohn's disease, unspecified, with unspecified complications: Secondary | ICD-10-CM | POA: Diagnosis not present

## 2020-05-20 DIAGNOSIS — D62 Acute posthemorrhagic anemia: Secondary | ICD-10-CM | POA: Diagnosis not present

## 2020-05-20 DIAGNOSIS — G9341 Metabolic encephalopathy: Secondary | ICD-10-CM | POA: Diagnosis present

## 2020-05-20 DIAGNOSIS — E222 Syndrome of inappropriate secretion of antidiuretic hormone: Secondary | ICD-10-CM | POA: Diagnosis not present

## 2020-05-20 DIAGNOSIS — K5090 Crohn's disease, unspecified, without complications: Secondary | ICD-10-CM | POA: Diagnosis present

## 2020-05-20 DIAGNOSIS — Y92009 Unspecified place in unspecified non-institutional (private) residence as the place of occurrence of the external cause: Secondary | ICD-10-CM | POA: Diagnosis not present

## 2020-05-20 DIAGNOSIS — E669 Obesity, unspecified: Secondary | ICD-10-CM | POA: Diagnosis present

## 2020-05-20 DIAGNOSIS — E119 Type 2 diabetes mellitus without complications: Secondary | ICD-10-CM | POA: Diagnosis present

## 2020-05-20 DIAGNOSIS — Y92239 Unspecified place in hospital as the place of occurrence of the external cause: Secondary | ICD-10-CM | POA: Diagnosis not present

## 2020-05-20 DIAGNOSIS — Z1623 Resistance to quinolones and fluoroquinolones: Secondary | ICD-10-CM | POA: Diagnosis present

## 2020-05-20 DIAGNOSIS — E8889 Other specified metabolic disorders: Secondary | ICD-10-CM | POA: Diagnosis present

## 2020-05-20 DIAGNOSIS — I469 Cardiac arrest, cause unspecified: Secondary | ICD-10-CM | POA: Diagnosis present

## 2020-05-20 DIAGNOSIS — Z79899 Other long term (current) drug therapy: Secondary | ICD-10-CM | POA: Diagnosis not present

## 2020-05-20 DIAGNOSIS — W06XXXA Fall from bed, initial encounter: Secondary | ICD-10-CM | POA: Diagnosis present

## 2020-05-20 DIAGNOSIS — I351 Nonrheumatic aortic (valve) insufficiency: Secondary | ICD-10-CM | POA: Diagnosis not present

## 2020-05-20 DIAGNOSIS — M25579 Pain in unspecified ankle and joints of unspecified foot: Secondary | ICD-10-CM | POA: Diagnosis present

## 2020-05-20 DIAGNOSIS — T40411A Poisoning by fentanyl or fentanyl analogs, accidental (unintentional), initial encounter: Secondary | ICD-10-CM | POA: Diagnosis present

## 2020-05-20 DIAGNOSIS — B961 Klebsiella pneumoniae [K. pneumoniae] as the cause of diseases classified elsewhere: Secondary | ICD-10-CM | POA: Diagnosis present

## 2020-05-20 DIAGNOSIS — W19XXXA Unspecified fall, initial encounter: Secondary | ICD-10-CM | POA: Diagnosis not present

## 2020-05-20 DIAGNOSIS — Z20822 Contact with and (suspected) exposure to covid-19: Secondary | ICD-10-CM | POA: Diagnosis present

## 2020-05-20 DIAGNOSIS — E559 Vitamin D deficiency, unspecified: Secondary | ICD-10-CM | POA: Diagnosis present

## 2020-05-20 DIAGNOSIS — Z96642 Presence of left artificial hip joint: Secondary | ICD-10-CM | POA: Diagnosis present

## 2020-05-20 DIAGNOSIS — J8489 Other specified interstitial pulmonary diseases: Secondary | ICD-10-CM | POA: Diagnosis present

## 2020-05-20 DIAGNOSIS — F419 Anxiety disorder, unspecified: Secondary | ICD-10-CM | POA: Diagnosis present

## 2020-05-20 DIAGNOSIS — J90 Pleural effusion, not elsewhere classified: Secondary | ICD-10-CM | POA: Diagnosis present

## 2020-05-20 DIAGNOSIS — D472 Monoclonal gammopathy: Secondary | ICD-10-CM | POA: Diagnosis present

## 2020-05-20 DIAGNOSIS — E871 Hypo-osmolality and hyponatremia: Secondary | ICD-10-CM | POA: Diagnosis not present

## 2020-05-20 DIAGNOSIS — N390 Urinary tract infection, site not specified: Secondary | ICD-10-CM | POA: Diagnosis present

## 2020-05-20 DIAGNOSIS — K219 Gastro-esophageal reflux disease without esophagitis: Secondary | ICD-10-CM | POA: Diagnosis present

## 2020-05-20 DIAGNOSIS — S82142A Displaced bicondylar fracture of left tibia, initial encounter for closed fracture: Secondary | ICD-10-CM | POA: Diagnosis present

## 2020-05-20 DIAGNOSIS — I10 Essential (primary) hypertension: Secondary | ICD-10-CM | POA: Diagnosis present

## 2020-05-20 DIAGNOSIS — E1169 Type 2 diabetes mellitus with other specified complication: Secondary | ICD-10-CM | POA: Diagnosis not present

## 2020-05-20 DIAGNOSIS — F0390 Unspecified dementia without behavioral disturbance: Secondary | ICD-10-CM | POA: Diagnosis present

## 2020-05-20 LAB — BASIC METABOLIC PANEL
Anion gap: 8 (ref 5–15)
BUN: <5 mg/dL — ABNORMAL LOW (ref 8–23)
CO2: 26 mmol/L (ref 22–32)
Calcium: 8.5 mg/dL — ABNORMAL LOW (ref 8.9–10.3)
Chloride: 92 mmol/L — ABNORMAL LOW (ref 98–111)
Creatinine, Ser: 0.49 mg/dL (ref 0.44–1.00)
GFR calc Af Amer: >60 mL/min (ref >60)
GFR calc non Af Amer: >60 mL/min (ref >60)
Glucose, Bld: 110 mg/dL — ABNORMAL HIGH (ref 70–99)
Potassium: 3.6 mmol/L (ref 3.5–5.1)
Sodium: 126 mmol/L — ABNORMAL LOW (ref 135–145)

## 2020-05-20 LAB — ECHOCARDIOGRAM COMPLETE
Height: 60 in
S' Lateral: 2.44 cm

## 2020-05-20 LAB — CBC
HCT: 29.3 % — ABNORMAL LOW (ref 36.0–46.0)
Hemoglobin: 9.8 g/dL — ABNORMAL LOW (ref 12.0–15.0)
MCH: 30.6 pg (ref 26.0–34.0)
MCHC: 33.4 g/dL (ref 30.0–36.0)
MCV: 91.6 fL (ref 80.0–100.0)
Platelets: 248 10*3/uL (ref 150–400)
RBC: 3.2 MIL/uL — ABNORMAL LOW (ref 3.87–5.11)
RDW: 13 % (ref 11.5–15.5)
WBC: 7.6 10*3/uL (ref 4.0–10.5)
nRBC: 0 % (ref 0.0–0.2)

## 2020-05-20 LAB — SURGICAL PCR SCREEN
MRSA, PCR: NEGATIVE
Staphylococcus aureus: NEGATIVE

## 2020-05-20 LAB — HEMOGLOBIN A1C
Hgb A1c MFr Bld: 6.1 % — ABNORMAL HIGH (ref 4.8–5.6)
Mean Plasma Glucose: 128.37 mg/dL

## 2020-05-20 MED ORDER — HYDRALAZINE HCL 20 MG/ML IJ SOLN
10.0000 mg | Freq: Four times a day (QID) | INTRAMUSCULAR | Status: DC | PRN
  Administered 2020-05-21 – 2020-06-04 (×9): 10 mg via INTRAVENOUS
  Filled 2020-05-20 (×9): qty 1

## 2020-05-20 MED ORDER — SODIUM CHLORIDE 0.9 % IV SOLN: INTRAVENOUS | Status: DC

## 2020-05-20 MED ORDER — ACETAMINOPHEN 325 MG PO TABS: 650.0000 mg | ORAL_TABLET | Freq: Four times a day (QID) | ORAL | Status: DC | PRN

## 2020-05-20 MED ORDER — CHLORHEXIDINE GLUCONATE 4 % EX LIQD
60.0000 mL | Freq: Once | CUTANEOUS | Status: AC
  Administered 2020-05-21: 4 via TOPICAL
  Filled 2020-05-20: qty 60

## 2020-05-20 NOTE — Progress Notes (Signed)
  Echocardiogram 2D Echocardiogram has been performed. Dustin Flock, RCS 05/20/2020, 3:15 PM

## 2020-05-20 NOTE — Progress Notes (Signed)
PROGRESS NOTE  Katherine Benson  DOB: [birth year]  PCP: Heber Beaulieu, MD JQZ:[id number]  DOA: 05/19/2020  LOS: 0 days   Chief Complaint  Patient presents with  . post CPR  . Fall   Brief narrative: Katherine Benson is a 79 y.o. female with PMH of DM2, HTN, Crohn's disease, MGUS who was recently hospitalized in [place] for a L tibial plateau fracture and discharged to rehab for nonoperative management.  On 9/7, she had another fall at rehab.  EMS attended. Unaware of her extreme sensitivity to opioid, she was given IV fentanyl after which she required a brief period of CPR.    In the ER, patient was lethargic, revived with Narcan. CT scan of the left lower extremity showed comminuted fractures of the proximal tibia involving the lateral > medial tibial plateau; depression of the articular surface and impaction at the fracture line Admitted for further evaluation and management.  Subjective: Patient was seen and examined this afternoon. Elderly Caucasian female.  Lying down in bed.  Not in distress.  Able to answer simple questions.  Slow to respond. Husband at bedside.  Discussed with son over the phone.  Assessment/Plan: Left tibial plateau fracture -Secondary to a fall. -Imaging findings as above showing comminuted fracture. -Seen by orthopedics. -Surgical management recommended.  Patient's family initially preferred transfer to [facility].  However with limited bed availability, family is okay to pursue surgical fixation here at [facility]. -Orthopedics following.  Immobilizer and nonweightbearing status at this time. -Continue pain control.  Elevated troponin -High-sensitivity troponin was 71 > 48 > 33 -Likely secondary to CPR, downtrending. -No cardiac symptoms.  Echocardiogram with EF 50 to 55%.  No wall motion abnormality. -Continue aspirin.  Perioperative medical risk assessment -Currently without cardiac symptoms.  Echocardiogram without wall motion abnormalities.  She is at  acceptable risk for planned surgery.  Extreme caution to avoid IV opioids  Progressive generalized weakness -Patient recently was hospitalized at [facility] sustaining left tibial fracture.  She was at [facility] for physical therapy and nonoperative management.  Per family, she has been having progressive generalized weakness. -Hopefully after surgical fixation of her fracture, she can participate with physical therapy.  Acute opiate overdose -Patient reportedly is highly sensitive to opioids.  Unaware of that EMS gave her IV fentanyl afterwards she went unresponsive.  She was revived after a dose of Narcan and about 4 minutes of CPR.  -Mental status back to baseline.  -Patient probably has underlying dementia based on her age and risk factors.  Family reports slow decline in mental status while in the rehab.  -Continue to monitor.   Hyponatremia -Likely secondary to poor oral intake.   -Start normal saline.  Repeat sodium level tomorrow.  Recent Labs  Lab 05/19/20 0504 05/20/20 0448  NA 126* 126*   Essential hypertension -Continue Diltiazem.  IV hydralazine as needed  MGUS -Last seen in clinic on 11/05/19 -Surveillance only  Crohn's Disease -Declines medications for this issue -On Omeprazole for GERD and Bentyl for IBS -Last seen by Gi on 09/25/19  DM -Pending A1c -Diet controlled -Last seen by endocrinology on 12/06/19  Other -Continue Tegretol, Lexapro - possibly associated with depression and cognitive impairment but no recent obvious notes about why she is taking these medications  Mobility: PT eval post procedure Code Status:   Code Status: Full Code  Nutritional status: Body mass index is 29.88 kg/m.     Diet Order            Diet Heart  Room service appropriate? Yes; Fluid consistency: Thin  Diet effective now                 DVT prophylaxis: Foot Pump / plexipulse Start: 05/19/20 0817 SCDs Start: 05/19/20 0815   Antimicrobials:  None Fluid:  NS at 50 mill per hour Consultants: Orthopedics Family Communication:  Discussed with patient's husband and son  Status is: Inpatient  Remains inpatient appropriate because:Persistent severe electrolyte disturbances, Ongoing diagnostic testing needed not appropriate for outpatient work up and IV treatments appropriate due to intensity of illness or inability to take PO   Dispo: The patient is from: Home              Anticipated d/c is to: Home Home versus SNF.  Pending PT eval              Anticipated d/c date is: 3 days              Patient currently is not medically stable to d/c.       Infusions:  . methocarbamol (ROBAXIN) IV      Scheduled Meds: . aspirin  325 mg Oral Daily  . carbamazepine  200 mg Oral BID  . chlorhexidine  60 mL Topical Once  . dicyclomine  10 mg Oral BID AC & HS  . diltiazem  180 mg Oral QHS  . docusate sodium  100 mg Oral BID  . escitalopram  10 mg Oral Daily  . loratadine  10 mg Oral Daily  . multivitamin  1 tablet Oral Daily  . pantoprazole  40 mg Oral Daily  . polyvinyl alcohol  1 drop Both Eyes BID  . senna-docusate  1 tablet Oral QHS  . triamcinolone  1 spray Nasal BID    Antimicrobials: Anti-infectives (From admission, onward)   None      PRN meds: acetaminophen, bisacodyl, hydrALAZINE, HYDROcodone-acetaminophen, methocarbamol **OR** methocarbamol (ROBAXIN) IV, morphine injection, ondansetron (ZOFRAN) IV, polyethylene glycol   Objective: Vitals:   05/20/20 0501 05/20/20 1252  BP: (!) 180/97 (!) 148/73  Pulse: 78 73  Resp: 17   Temp: 98.6 F (37 C) 98.3 F (36.8 C)  SpO2: 98% 96%    Intake/Output Summary (Last 24 hours) at 05/20/2020 1553 Last data filed at 05/20/2020 1315 Gross per 24 hour  Intake 360 ml  Output 700 ml  Net -340 ml   There were no vitals filed for this visit. Weight change:  Body mass index is 29.88 kg/m.   Physical Exam: General exam: Appears calm and comfortable.  Not in physical distress Skin: No  rashes, lesions or ulcers. HEENT: Atraumatic, normocephalic, supple neck, no obvious bleeding Lungs: Clear to auscultation bilaterally CVS: Regular rate and rhythm, no murmur GI/Abd soft, nontender, nondistended, bowel sound present CNS: Alert, awake, oriented to place and person.  Slow to respond Psychiatry: Depressed look Extremities: No pedal edema, no calf tenderness  Data Review: I have personally reviewed the laboratory data and studies available.  Recent Labs  Lab 05/19/20 0504 05/20/20 0448  WBC 16.2* 7.6  NEUTROABS 15.0*  --   HGB 10.9* 9.8*  HCT 32.1* 29.3*  MCV 90.9 91.6  PLT 313 248   Recent Labs  Lab 05/19/20 0504 05/20/20 0448  NA 126* 126*  K 3.4* 3.6  CL 89* 92*  CO2 27 26  GLUCOSE 147* 110*  BUN 8 <5*  CREATININE 0.68 0.49  CALCIUM 8.7* 8.5*   Signed, Terrilee Croak, MD [facility] Pager: [phone] (Secure Chat preferred). 05/20/2020

## 2020-05-20 NOTE — TOC Progression Note (Signed)
Transition of Care [facility]) - Progression Note    Patient Details  Name: Rosha Cocker MRN: [id number] Date of Birth: Feb 12, 1941  Transition of Care [facility]) CM/SW [place], [place] Phone Number: 05/20/2020, 3:36 PM  Clinical Narrative:    Per RN plan for pt to have surgical repair of fx tomorrow. CSW left message for Luellen Pucker in admissions to update her about ongoing care plans.    Expected Discharge Plan: [place] Barriers to Discharge: Continued Medical Work up, Orthoptist and Services Expected Discharge Plan: [place] In-house Referral: Clinical Social Work Discharge Planning Services: [employer] Consult Post Acute Care Choice: [place], Resumption of Svcs/PTA Provider Living arrangements for the past 2 months: [place], [place]  Readmission Risk Interventions No flowsheet data found.

## 2020-05-20 NOTE — Anesthesia Preprocedure Evaluation (Addendum)
Anesthesia Evaluation  Patient identified by MRN, date of birth, ID band Patient awake    Reviewed: Allergy & Precautions, NPO status , Patient's Chart, lab work & pertinent test results  Airway Mallampati: II  TM Distance: >3 FB     Dental   Pulmonary neg pulmonary ROS,    breath sounds clear to auscultation       Cardiovascular hypertension,  Rhythm:Regular Rate:Normal     Neuro/Psych negative neurological ROS     GI/Hepatic negative GI ROS, Neg liver ROS,   Endo/Other  diabetes  Renal/GU negative Renal ROS     Musculoskeletal   Abdominal   Peds  Hematology   Anesthesia Other Findings   Reproductive/Obstetrics                            Anesthesia Physical Anesthesia Plan  ASA: III  Anesthesia Plan: General   Post-op Pain Management:    Induction:   PONV Risk Score and Plan:   Airway Management Planned: Oral ETT  Additional Equipment:   Intra-op Plan:   Post-operative Plan:   Informed Consent: I have reviewed the patients History and Physical, chart, labs and discussed the procedure including the risks, benefits and alternatives for the proposed anesthesia with the patient or authorized representative who has indicated his/her understanding and acceptance.     Dental advisory given  Plan Discussed with: CRNA and Anesthesiologist  Anesthesia Plan Comments:        Anesthesia Quick Evaluation

## 2020-05-21 ENCOUNTER — Inpatient Hospital Stay (HOSPITAL_COMMUNITY): Payer: Medicare HMO

## 2020-05-21 ENCOUNTER — Encounter (HOSPITAL_COMMUNITY)
Admission: EM | Disposition: A | Discharge status: Skilled Nursing Facility | Payer: Self-pay | Source: Skilled Nursing Facility | Attending: Family Medicine

## 2020-05-21 ENCOUNTER — Inpatient Hospital Stay (HOSPITAL_COMMUNITY): Payer: Medicare HMO | Admitting: Anesthesiology

## 2020-05-21 ENCOUNTER — Encounter (HOSPITAL_COMMUNITY): Payer: Self-pay | Admitting: Internal Medicine

## 2020-05-21 HISTORY — PX: ORIF TIBIA PLATEAU: SHX2132

## 2020-05-21 LAB — CBC WITH DIFFERENTIAL/PLATELET
Abs Immature Granulocytes: 0.02 10*3/uL (ref 0.00–0.07)
Basophils Absolute: 0 10*3/uL (ref 0.0–0.1)
Basophils Relative: 0 %
Eosinophils Absolute: 0.1 10*3/uL (ref 0.0–0.5)
Eosinophils Relative: 2 %
HCT: 26 % — ABNORMAL LOW (ref 36.0–46.0)
Hemoglobin: 8.4 g/dL — ABNORMAL LOW (ref 12.0–15.0)
Immature Granulocytes: 0 %
Lymphocytes Relative: 11 %
Lymphs Abs: 0.7 10*3/uL (ref 0.7–4.0)
MCH: 30.1 pg (ref 26.0–34.0)
MCHC: 32.3 g/dL (ref 30.0–36.0)
MCV: 93.2 fL (ref 80.0–100.0)
Monocytes Absolute: 0.6 10*3/uL (ref 0.1–1.0)
Monocytes Relative: 10 %
Neutro Abs: 4.6 10*3/uL (ref 1.7–7.7)
Neutrophils Relative %: 77 %
Platelets: 200 10*3/uL (ref 150–400)
RBC: 2.79 MIL/uL — ABNORMAL LOW (ref 3.87–5.11)
RDW: 12.9 % (ref 11.5–15.5)
WBC: 6 10*3/uL (ref 4.0–10.5)
nRBC: 0 % (ref 0.0–0.2)

## 2020-05-21 LAB — BASIC METABOLIC PANEL
Anion gap: 7 (ref 5–15)
BUN: <5 mg/dL — ABNORMAL LOW (ref 8–23)
CO2: 27 mmol/L (ref 22–32)
Calcium: 7.9 mg/dL — ABNORMAL LOW (ref 8.9–10.3)
Chloride: 96 mmol/L — ABNORMAL LOW (ref 98–111)
Creatinine, Ser: 0.67 mg/dL (ref 0.44–1.00)
GFR calc Af Amer: >60 mL/min (ref >60)
GFR calc non Af Amer: >60 mL/min (ref >60)
Glucose, Bld: 95 mg/dL (ref 70–99)
Potassium: 3.4 mmol/L — ABNORMAL LOW (ref 3.5–5.1)
Sodium: 130 mmol/L — ABNORMAL LOW (ref 135–145)

## 2020-05-21 LAB — TYPE AND SCREEN
ABO/RH(D): A POS
Antibody Screen: NEGATIVE

## 2020-05-21 LAB — GLUCOSE, CAPILLARY: Glucose-Capillary: 119 mg/dL — ABNORMAL HIGH (ref 70–99)

## 2020-05-21 SURGERY — OPEN REDUCTION INTERNAL FIXATION (ORIF) TIBIAL PLATEAU
Anesthesia: General | Laterality: Left

## 2020-05-21 MED ORDER — FENTANYL CITRATE (PF) 250 MCG/5ML IJ SOLN
INTRAMUSCULAR | Status: AC
  Filled 2020-05-21: qty 5

## 2020-05-21 MED ORDER — VANCOMYCIN HCL 1000 MG IV SOLR
INTRAVENOUS | Status: AC
  Filled 2020-05-21: qty 1000

## 2020-05-21 MED ORDER — FENTANYL CITRATE (PF) 250 MCG/5ML IJ SOLN
INTRAMUSCULAR | Status: DC | PRN
Start: 2020-05-21 — End: 2020-05-21

## 2020-05-21 MED ORDER — VANCOMYCIN HCL 1000 MG IV SOLR
INTRAVENOUS | Status: DC | PRN
  Administered 2020-05-21: 1000 mg via TOPICAL

## 2020-05-21 MED ORDER — CLINDAMYCIN PHOSPHATE 900 MG/50ML IV SOLN
900.0000 mg | Freq: Three times a day (TID) | INTRAVENOUS | Status: AC
  Administered 2020-05-21 – 2020-05-22 (×3): 900 mg via INTRAVENOUS
  Filled 2020-05-21 (×3): qty 50

## 2020-05-21 MED ORDER — PROPOFOL 10 MG/ML IV BOLUS
INTRAVENOUS | Status: AC
  Filled 2020-05-21: qty 20

## 2020-05-21 MED ORDER — ACETAMINOPHEN 325 MG PO TABS
325.0000 mg | ORAL_TABLET | Freq: Four times a day (QID) | ORAL | Status: DC | PRN
  Filled 2020-05-21: qty 2

## 2020-05-21 MED ORDER — METOCLOPRAMIDE HCL 5 MG/ML IJ SOLN
INTRAMUSCULAR | Status: AC
  Filled 2020-05-21: qty 2

## 2020-05-21 MED ORDER — ONDANSETRON HCL 4 MG/2ML IJ SOLN
INTRAMUSCULAR | Status: DC | PRN
  Administered 2020-05-21: 4 mg via INTRAVENOUS

## 2020-05-21 MED ORDER — FENTANYL CITRATE (PF) 100 MCG/2ML IJ SOLN
INTRAMUSCULAR | Status: AC
  Filled 2020-05-21: qty 2

## 2020-05-21 MED ORDER — DEXAMETHASONE SODIUM PHOSPHATE 10 MG/ML IJ SOLN
INTRAMUSCULAR | Status: AC
  Filled 2020-05-21: qty 1

## 2020-05-21 MED ORDER — ENOXAPARIN SODIUM 40 MG/0.4ML ~~LOC~~ SOLN
40.0000 mg | SUBCUTANEOUS | Status: DC
  Administered 2020-05-22 – 2020-06-05 (×15): 40 mg via SUBCUTANEOUS
  Filled 2020-05-21 (×16): qty 0.4

## 2020-05-21 MED ORDER — 0.9 % SODIUM CHLORIDE (POUR BTL) OPTIME
TOPICAL | Status: DC | PRN
  Administered 2020-05-21: 1000 mL

## 2020-05-21 MED ORDER — LIDOCAINE 2% (20 MG/ML) 5 ML SYRINGE
INTRAMUSCULAR | Status: DC | PRN
  Administered 2020-05-21: 80 mg via INTRAVENOUS

## 2020-05-21 MED ORDER — CLINDAMYCIN PHOSPHATE 900 MG/50ML IV SOLN
INTRAVENOUS | Status: DC | PRN
  Administered 2020-05-21: 900 mg via INTRAVENOUS

## 2020-05-21 MED ORDER — FENTANYL CITRATE (PF) 100 MCG/2ML IJ SOLN
25.0000 ug | INTRAMUSCULAR | Status: DC | PRN
  Administered 2020-05-21 (×3): 25 ug via INTRAVENOUS

## 2020-05-21 MED ORDER — ROCURONIUM BROMIDE 10 MG/ML (PF) SYRINGE
PREFILLED_SYRINGE | INTRAVENOUS | Status: AC
  Filled 2020-05-21: qty 10

## 2020-05-21 MED ORDER — SUGAMMADEX SODIUM 200 MG/2ML IV SOLN
INTRAVENOUS | Status: DC | PRN
  Administered 2020-05-21: 150 mg via INTRAVENOUS

## 2020-05-21 MED ORDER — ONDANSETRON HCL 4 MG/2ML IJ SOLN
INTRAMUSCULAR | Status: AC
  Filled 2020-05-21: qty 2

## 2020-05-21 MED ORDER — PHENYLEPHRINE 40 MCG/ML (10ML) SYRINGE FOR IV PUSH (FOR BLOOD PRESSURE SUPPORT)
PREFILLED_SYRINGE | INTRAVENOUS | Status: DC | PRN
  Administered 2020-05-21: 80 ug via INTRAVENOUS
  Administered 2020-05-21: 120 ug via INTRAVENOUS

## 2020-05-21 MED ORDER — CLINDAMYCIN PHOSPHATE 900 MG/50ML IV SOLN
INTRAVENOUS | Status: AC
  Filled 2020-05-21: qty 50

## 2020-05-21 MED ORDER — LACTATED RINGERS IV SOLN: INTRAVENOUS | Status: DC | PRN

## 2020-05-21 MED ORDER — ROCURONIUM BROMIDE 10 MG/ML (PF) SYRINGE
PREFILLED_SYRINGE | INTRAVENOUS | Status: DC | PRN
  Administered 2020-05-21: 50 mg via INTRAVENOUS
  Administered 2020-05-21: 10 mg via INTRAVENOUS

## 2020-05-21 MED ORDER — FENTANYL CITRATE (PF) 250 MCG/5ML IJ SOLN
INTRAMUSCULAR | Status: DC | PRN
Start: 2020-05-21 — End: 2020-05-21
  Administered 2020-05-21 (×2): 25 ug via INTRAVENOUS

## 2020-05-21 MED ORDER — METOCLOPRAMIDE HCL 5 MG/ML IJ SOLN
5.0000 mg | Freq: Four times a day (QID) | INTRAMUSCULAR | Status: AC | PRN
  Administered 2020-05-21: 5 mg via INTRAVENOUS

## 2020-05-21 MED ORDER — PROPOFOL 10 MG/ML IV BOLUS
INTRAVENOUS | Status: DC | PRN
  Administered 2020-05-21: 140 mg via INTRAVENOUS

## 2020-05-21 MED ORDER — PHENYLEPHRINE 40 MCG/ML (10ML) SYRINGE FOR IV PUSH (FOR BLOOD PRESSURE SUPPORT)
PREFILLED_SYRINGE | INTRAVENOUS | Status: AC
  Filled 2020-05-21: qty 10

## 2020-05-21 MED ORDER — PHENYLEPHRINE HCL-NACL 10-0.9 MG/250ML-% IV SOLN
INTRAVENOUS | Status: DC | PRN
  Administered 2020-05-21: 25 ug/min via INTRAVENOUS

## 2020-05-21 MED ORDER — LIDOCAINE 2% (20 MG/ML) 5 ML SYRINGE
INTRAMUSCULAR | Status: AC
  Filled 2020-05-21: qty 5

## 2020-05-21 MED ORDER — DEXAMETHASONE SODIUM PHOSPHATE 10 MG/ML IJ SOLN
INTRAMUSCULAR | Status: DC | PRN
  Administered 2020-05-21: 5 mg via INTRAVENOUS

## 2020-05-21 SURGICAL SUPPLY — 83 items
APL PRP STRL LF DISP 70% ISPRP (MISCELLANEOUS) ×2
BANDAGE ESMARK 6X9 LF (GAUZE/BANDAGES/DRESSINGS) ×1 IMPLANT
BIT DRILL 2.5 X LONG (BIT) ×1
BIT DRILL CALIBR QC 2.5X250 (BIT) ×1 IMPLANT
BIT DRILL X LONG 2.5 (BIT) IMPLANT
BLADE CLIPPER SURG (BLADE) IMPLANT
BLADE SURG 15 STRL LF DISP TIS (BLADE) ×1 IMPLANT
BLADE SURG 15 STRL SS (BLADE) ×2
BNDG CMPR 9X6 STRL LF SNTH (GAUZE/BANDAGES/DRESSINGS) ×1
BNDG ELASTIC 4X5.8 VLCR STR LF (GAUZE/BANDAGES/DRESSINGS) ×2 IMPLANT
BNDG ELASTIC 6X5.8 VLCR STR LF (GAUZE/BANDAGES/DRESSINGS) ×2 IMPLANT
BNDG ESMARK 6X9 LF (GAUZE/BANDAGES/DRESSINGS) ×2
BNDG GAUZE ELAST 4 BULKY (GAUZE/BANDAGES/DRESSINGS) ×1 IMPLANT
BONE CANC CHIPS 20CC PCAN1/4 (Bone Implant) ×2 IMPLANT
BRUSH SCRUB EZ PLAIN DRY (MISCELLANEOUS) ×2 IMPLANT
CANISTER SUCT 3000ML PPV (MISCELLANEOUS) ×2 IMPLANT
CHIPS CANC BONE 20CC PCAN1/4 (Bone Implant) ×1 IMPLANT
CHLORAPREP W/TINT 26 (MISCELLANEOUS) ×4 IMPLANT
COVER SURGICAL LIGHT HANDLE (MISCELLANEOUS) ×2 IMPLANT
COVER WAND RF STERILE (DRAPES) ×1 IMPLANT
CUFF TOURN SGL QUICK 34 (TOURNIQUET CUFF) ×2
CUFF TRNQT CYL 34X4.125X (TOURNIQUET CUFF) ×1 IMPLANT
DRAPE C-ARM 42X72 X-RAY (DRAPES) ×2 IMPLANT
DRAPE C-ARMOR (DRAPES) ×2 IMPLANT
DRAPE ORTHO SPLIT 77X108 STRL (DRAPES) ×4
DRAPE SURG ORHT 6 SPLT 77X108 (DRAPES) ×2 IMPLANT
DRAPE U-SHAPE 47X51 STRL (DRAPES) ×2 IMPLANT
DRILL BIT X LONG 2.5 (BIT) ×2
DRSG MEPITEL 4X7.2 (GAUZE/BANDAGES/DRESSINGS) ×1 IMPLANT
DRSG PAD ABDOMINAL 8X10 ST (GAUZE/BANDAGES/DRESSINGS) ×2 IMPLANT
ELECT REM PT RETURN 9FT ADLT (ELECTROSURGICAL) ×2
ELECTRODE REM PT RTRN 9FT ADLT (ELECTROSURGICAL) ×1 IMPLANT
GAUZE SPONGE 4X4 12PLY STRL (GAUZE/BANDAGES/DRESSINGS) ×3 IMPLANT
GLOVE BIO SURGEON STRL SZ 6.5 (GLOVE) ×6 IMPLANT
GLOVE BIO SURGEON STRL SZ7.5 (GLOVE) ×8 IMPLANT
GLOVE BIOGEL PI IND STRL 6.5 (GLOVE) ×1 IMPLANT
GLOVE BIOGEL PI IND STRL 7.5 (GLOVE) ×1 IMPLANT
GLOVE BIOGEL PI INDICATOR 6.5 (GLOVE) ×1
GLOVE BIOGEL PI INDICATOR 7.5 (GLOVE) ×2
GOWN STRL REUS W/ TWL LRG LVL3 (GOWN DISPOSABLE) ×2 IMPLANT
GOWN STRL REUS W/TWL LRG LVL3 (GOWN DISPOSABLE) ×6
GRAFT BNE CANC CHIPS 1-8 20CC (Bone Implant) IMPLANT
IMMOBILIZER KNEE 22 UNIV (SOFTGOODS) ×1 IMPLANT
K-WIRE 1.6X150 (WIRE) ×2
KIT BASIN OR (CUSTOM PROCEDURE TRAY) ×2 IMPLANT
KIT TURNOVER KIT B (KITS) ×2 IMPLANT
KWIRE 1.6X150 (WIRE) IMPLANT
NDL SUT 6 .5 CRC .975X.05 MAYO (NEEDLE) ×1 IMPLANT
NEEDLE MAYO TAPER (NEEDLE) ×2
NS IRRIG 1000ML POUR BTL (IV SOLUTION) ×2 IMPLANT
PACK TOTAL JOINT (CUSTOM PROCEDURE TRAY) ×2 IMPLANT
PAD ARMBOARD 7.5X6 YLW CONV (MISCELLANEOUS) ×4 IMPLANT
PAD CAST 4YDX4 CTTN HI CHSV (CAST SUPPLIES) ×1 IMPLANT
PADDING CAST COTTON 4X4 STRL (CAST SUPPLIES) ×2
PADDING CAST COTTON 6X4 STRL (CAST SUPPLIES) ×2 IMPLANT
PLATE LOCK VA-LCP 3.5X117 6H (Plate) ×1 IMPLANT
SCREW CORT HEADED ST 3.5X30 (Screw) ×1 IMPLANT
SCREW CORT HEADED ST 3.5X32 (Screw) ×1 IMPLANT
SCREW HEADED ST 3.5X36 (Screw) ×1 IMPLANT
SCREW HEADED ST 3.5X38 (Screw) ×1 IMPLANT
SCREW HEADED ST 3.5X50 (Screw) ×1 IMPLANT
SCREW HEADED ST 3.5X68 (Screw) ×1 IMPLANT
SCREW HEADED ST 3.5X75 (Screw) ×1 IMPLANT
SCREW LOCKING VA 3.5X75MM (Screw) ×2 IMPLANT
SCREW VA-LOCKING 65MM 3.5 (Screw) ×1 IMPLANT
STAPLER VISISTAT 35W (STAPLE) ×1 IMPLANT
SUCTION FRAZIER HANDLE 10FR (MISCELLANEOUS) ×2
SUCTION TUBE FRAZIER 10FR DISP (MISCELLANEOUS) ×1 IMPLANT
SUT ETHILON 2 0 FS 18 (SUTURE) ×1 IMPLANT
SUT ETHILON 3 0 PS 1 (SUTURE) IMPLANT
SUT FIBERWIRE #2 38 T-5 BLUE (SUTURE)
SUT VIC AB 0 CT1 27 (SUTURE) ×6
SUT VIC AB 0 CT1 27XBRD ANBCTR (SUTURE) IMPLANT
SUT VIC AB 1 CT1 18XCR BRD 8 (SUTURE) IMPLANT
SUT VIC AB 1 CT1 27 (SUTURE) ×2
SUT VIC AB 1 CT1 27XBRD ANBCTR (SUTURE) ×1 IMPLANT
SUT VIC AB 1 CT1 8-18 (SUTURE)
SUT VIC AB 2-0 CT1 27 (SUTURE) ×4
SUT VIC AB 2-0 CT1 TAPERPNT 27 (SUTURE) ×2 IMPLANT
SUTURE FIBERWR #2 38 T-5 BLUE (SUTURE) IMPLANT
TOWEL GREEN STERILE (TOWEL DISPOSABLE) ×4 IMPLANT
TRAY FOLEY MTR SLVR 16FR STAT (SET/KITS/TRAYS/PACK) ×1 IMPLANT
WATER STERILE IRR 1000ML POUR (IV SOLUTION) ×3 IMPLANT

## 2020-05-21 NOTE — Interval H&P Note (Signed)
Plan to proceed with ORIF please see attestation for ortho consult note. Risks and benefits discussed with patients son and he agrees to proceed.  Thomasene Lot Emerson Schreifels MD

## 2020-05-21 NOTE — Progress Notes (Signed)
PROGRESS NOTE  Katherine Benson  DOB: [birth year]  PCP: Heber [place], MD AST:[id number]  DOA: 05/19/2020  LOS: 1 day   Chief Complaint  Patient presents with  . post CPR  . Fall   Brief narrative: Katherine Benson is a 79 y.o. female with PMH of DM2, HTN, Crohn's disease, MGUS who was recently hospitalized in [place] for a L tibial plateau fracture and discharged to rehab for nonoperative management.  On 9/7, she had another fall at rehab.  EMS attended. Unaware of her extreme sensitivity to opioid, she was given IV fentanyl after which she required a brief period of CPR.    In the ER, patient was lethargic, revived with Narcan. CT scan of the left lower extremity showed comminuted fractures of the proximal tibia involving the lateral > medial tibial plateau; depression of the articular surface and impaction at the fracture line Admitted for further evaluation and management.  Subjective: Patient was seen and examined this afternoon. Lying down on bed.  Underwent ORIF today for left tibial fracture Pain controlled. Husband was at bedside.  Assessment/Plan: Left tibial plateau fracture -Secondary to a fall. -Imaging findings as above showing comminuted fracture. -9/9, underwent ORIF of left tibia fracture by orthopedics. -Noted a plan of hinged knee brace locked in extension for 2 weeks.  Gentle range of motion after that. -Nonweightbearing status recommended -Lovenox for DVT prophylaxis per orthopedics -Continue pain control.  Avoid IV opioids because of her history of high sensitivity to it.  Elevated troponin -High-sensitivity troponin was 71 > 48 > 33 -Likely secondary to CPR, down trended overall -No cardiac symptoms.  Echocardiogram with EF 50 to 55%.  No wall motion abnormality. -Continue aspirin.  Progressive generalized weakness -Patient recently was hospitalized at [facility] sustaining left tibial fracture. She was at [facility] for physical therapy and  nonoperative management.  Per family, she has been having progressive generalized weakness. -PT evaluation pending  Acute opiate overdose -Patient reportedly is highly sensitive to opioids.  Unaware of that EMS gave her IV fentanyl after which she became unresponsive.  She was revived after a dose of Narcan and about 4 minutes of CPR.  -Avoid IV opioids.  Acute metabolic encephalopathy -Mental status was acutely impaired on admission because of fentanyl overdose. -Improving. Still remains confused but family states this has been her baseline mental status last few weeks after first fracture.  -Patient probably has underlying dementia based on her age and risk factors. -Continue to monitor.   Hyponatremia -Likely secondary to poor oral intake.   -Improving on normal saline. Continue IV fluid for today.  Recent Labs  Lab 05/19/20 0504 05/20/20 0448 05/21/20 0447  NA 126* 126* 130*   Essential hypertension -Continue Diltiazem.  IV hydralazine as needed  MGUS -Last seen in clinic on 11/05/19 -Surveillance only  Crohn's Disease -Declines medications for this issue -On Omeprazole for GERD and Bentyl for IBS -Last seen by Gi on 09/25/19  DM Lab Results  Component Value Date   HGBA1C 6.1 (H) 05/20/2020   -Diet controlled -Last seen by endocrinology on 12/06/19  Other -Continue Tegretol, Lexapro - possibly associated with depression and cognitive impairment but no recent obvious notes about why she is taking these medications  Mobility: PT eval post procedure Code Status:   Code Status: Full Code  Nutritional status: Body mass index is 30.44 kg/m.     Diet Order            Diet Heart Room service appropriate? Yes; Fluid consistency:  Thin  Diet effective now                 DVT prophylaxis: enoxaparin (LOVENOX) injection 40 mg Start: 05/22/20 0900 Foot Pump / plexipulse Start: 05/19/20 0817 SCDs Start: 05/19/20 0815   Antimicrobials:  None Fluid: NS at 50 mill  per hour Consultants: Orthopedics Family Communication:  Discussed with patient's husband at bedside  Status is: Inpatient  Remains inpatient appropriate because -underwent surgery today.  Needs PT evaluation.  Dispo: The patient is from: Home              Anticipated d/c is to: Likely SNF              Anticipated d/c date is: 2 to 3 days              Patient currently is not medically stable to d/c.   Infusions:  . sodium chloride 50 mL/hr at 05/20/20 2318  . clindamycin (CLEOCIN) IV    . methocarbamol (ROBAXIN) IV      Scheduled Meds: . aspirin  325 mg Oral Daily  . carbamazepine  200 mg Oral BID  . dicyclomine  10 mg Oral BID AC & HS  . diltiazem  180 mg Oral QHS  . docusate sodium  100 mg Oral BID  . [START ON 05/22/2020] enoxaparin (LOVENOX) injection  40 mg Subcutaneous Q24H  . escitalopram  10 mg Oral Daily  . fentaNYL      . loratadine  10 mg Oral Daily  . metoCLOPramide      . multivitamin  1 tablet Oral Daily  . pantoprazole  40 mg Oral Daily  . polyvinyl alcohol  1 drop Both Eyes BID  . senna-docusate  1 tablet Oral QHS  . triamcinolone  1 spray Nasal BID    Antimicrobials: Anti-infectives (From admission, onward)   Start     Dose/Rate Route Frequency Ordered Stop   05/21/20 1630  clindamycin (CLEOCIN) IVPB 900 mg       Note to Pharmacy: Please give 8 hours from intraoperative dose   900 mg 100 mL/hr over 30 Minutes Intravenous Every 8 hours 05/21/20 1247 05/22/20 1629   05/21/20 1003  vancomycin (VANCOCIN) powder  Status:  Discontinued          As needed 05/21/20 1003 05/21/20 1031      PRN meds: acetaminophen, bisacodyl, hydrALAZINE, HYDROcodone-acetaminophen, methocarbamol **OR** methocarbamol (ROBAXIN) IV, morphine injection, ondansetron (ZOFRAN) IV, polyethylene glycol   Objective: Vitals:   05/21/20 1215 05/21/20 1249  BP: (!) 155/83 (!) 164/87  Pulse: 92 100  Resp: (!) 22 19  Temp: 98.4 F (36.9 C) 98.2 F (36.8 C)  SpO2: 99% 98%     Intake/Output Summary (Last 24 hours) at 05/21/2020 1501 Last data filed at 05/21/2020 1200 Gross per 24 hour  Intake 3719.76 ml  Output 2295 ml  Net 1424.76 ml   Filed Weights   05/21/20 1215  Weight: 70.7 kg   Weight change:  Body mass index is 30.44 kg/m.   Physical Exam: General exam: Appears calm and comfortable. Not in physical distress Skin: No rashes, lesions or ulcers. HEENT: Atraumatic, normocephalic, supple neck, no obvious bleeding Lungs: Clear to auscultation bilaterally CVS: Regular rate and rhythm, no murmur GI/Abd soft, nontender, nondistended, bowel sound present CNS: Alert, awake, oriented to place and person.  Slow to respond Psychiatry: Depressed look Extremities: No pedal edema, no calf tenderness.  Bandages left leg post procedure  Data Review: I have personally reviewed  the laboratory data and studies available.  Recent Labs  Lab 05/19/20 0504 05/20/20 0448 05/21/20 0447  WBC 16.2* 7.6 6.0  NEUTROABS 15.0*  --  4.6  HGB 10.9* 9.8* 8.4*  HCT 32.1* 29.3* 26.0*  MCV 90.9 91.6 93.2  PLT 313 248 200   Recent Labs  Lab 05/19/20 0504 05/20/20 0448 05/21/20 0447  NA 126* 126* 130*  K 3.4* 3.6 3.4*  CL 89* 92* 96*  CO2 27 26 27   GLUCOSE 147* 110* 95  BUN 8 <5* <5*  CREATININE 0.68 0.49 0.67  CALCIUM 8.7* 8.5* 7.9*   Signed, Terrilee Croak, MD [facility] 05/21/2020

## 2020-05-21 NOTE — Progress Notes (Signed)
PT Cancellation Note  Patient Details Name: Katherine Benson MRN: [id number] DOB: Aug 03, 1941   Cancelled Treatment:    Reason Eval/Treat Not Completed: Patient declined, no reason specified;Pain limiting ability to participate. PT attempts to see pt twice on this date for PT evaluation, pt declines both times due to pain. PT will attempt to follow up tomorrow.   Zenaida Niece 05/21/2020, 4:44 PM

## 2020-05-21 NOTE — NC FL2 (Signed)
[place] MEDICAID FL2 LEVEL OF CARE SCREENING TOOL     IDENTIFICATION  Patient Name: Katherine Benson Birthdate: 1940-10-03 Sex: female Admission Date (Current Location): 05/19/2020  [facility] [place] and [place] Number:  Herbalist and Address:  [facility] [place], [place] [address], [place], [place] [address]      Provider Number: [id number]  Attending Physician Name and Address:  Terrilee Croak, MD  Relative Name and Phone Number:       Current Level of Care: Hospital Recommended Level of Care: [place] Prior Approval Number:    Date Approved/Denied:   PASRR Number: [id number]  Discharge Plan: SNF    Current Diagnoses: Patient Active Problem List   Diagnosis Date Noted  . Unresponsive episode 05/19/2020  . Fall at home, initial encounter 05/19/2020  . Tibial plateau fracture, left, closed, with delayed healing, subsequent encounter 05/19/2020  . Acute hyponatremia 05/19/2020  . Hypertension   . MGUS (monoclonal gammopathy of unknown significance)   . Crohn disease ([place])   . Diabetes mellitus type 2 in obese (HCC)     Orientation RESPIRATION BLADDER Height & Weight     Self, Situation, Place  O2 (nasal canula) Incontinent, External catheter Weight: 155 lb 13.8 oz (70.7 kg) Height:  5' (152.4 cm)  BEHAVIORAL SYMPTOMS/MOOD NEUROLOGICAL BOWEL NUTRITION STATUS      Continent Diet (see discharge summary)  AMBULATORY STATUS COMMUNICATION OF NEEDS Skin   Extensive Assist Verbally Surgical wounds, Bruising (dry skin; closed incision w/ compression wrap on Left leg)                       Personal Care Assistance Level of Assistance  Bathing, Feeding, Dressing Bathing Assistance: Maximum assistance Feeding assistance: Independent Dressing Assistance: Maximum assistance     Functional Limitations Info  Sight, Hearing, Speech Sight Info: Impaired Hearing Info: Adequate Speech Info: Adequate    SPECIAL CARE FACTORS FREQUENCY  PT  (By licensed PT), OT (By licensed OT)     PT Frequency: 5x week OT Frequency: 5x week            Contractures Contractures Info: Not present    Additional Factors Info  Code Status, Allergies, Psychotropic Code Status Info: Full Code Allergies Info: Cefaclor, Mercaptopurine, Penicillins, Sulfa Antibiotics, Erythromycin Psychotropic Info: escitalopram (LEXAPRO) tablet 10 mg daily PO         Current Medications (05/21/2020):  This is the current hospital active medication list Current Facility-Administered Medications  Medication Dose Route Frequency Provider Last Rate Last Admin  . 0.9 %  sodium chloride infusion   Intravenous Continuous Delray Alt, PA-C 50 mL/hr at 05/20/20 2318 New Bag at 05/20/20 2318  . acetaminophen (TYLENOL) tablet 325-650 mg  325-650 mg Oral Q6H PRN Delray Alt, PA-C      . aspirin EC tablet 325 mg  325 mg Oral Daily Patrecia Pace A, PA-C   325 mg at 05/21/20 1314  . bisacodyl (DULCOLAX) EC tablet 5 mg  5 mg Oral Daily PRN Delray Alt, PA-C      . carbamazepine (TEGRETOL) tablet 200 mg  200 mg Oral BID Patrecia Pace A, PA-C   200 mg at 05/20/20 2300  . clindamycin (CLEOCIN) IVPB 900 mg  900 mg Intravenous Q8H Yacobi, Sarah A, PA-C      . dicyclomine (BENTYL) capsule 10 mg  10 mg Oral BID AC & HS Patrecia Pace A, PA-C   10 mg at 05/20/20 2300  .  diltiazem (CARDIZEM CD) 24 hr capsule 180 mg  180 mg Oral QHS Patrecia Pace A, PA-C   180 mg at 05/20/20 2300  . docusate sodium (COLACE) capsule 100 mg  100 mg Oral BID Patrecia Pace A, PA-C   100 mg at 05/21/20 1315  . [START ON 05/22/2020] enoxaparin (LOVENOX) injection 40 mg  40 mg Subcutaneous Q24H Yacobi, Sarah A, PA-C      . escitalopram (LEXAPRO) tablet 10 mg  10 mg Oral Daily Patrecia Pace A, PA-C   10 mg at 05/21/20 1313  . fentaNYL (SUBLIMAZE) 100 MCG/2ML injection           . hydrALAZINE (APRESOLINE) injection 10 mg  10 mg Intravenous Q6H PRN Patrecia Pace A, PA-C   10 mg at 05/21/20 0522  .  HYDROcodone-acetaminophen (NORCO/VICODIN) 5-325 MG per tablet 1-2 tablet  1-2 tablet Oral Q6H PRN Delray Alt, PA-C   2 tablet at 05/20/20 2300  . loratadine (CLARITIN) tablet 10 mg  10 mg Oral Daily Patrecia Pace A, PA-C   10 mg at 05/21/20 1314  . methocarbamol (ROBAXIN) tablet 500 mg  500 mg Oral Q6H PRN Delray Alt, PA-C   500 mg at 05/20/20 9563   Or  . methocarbamol (ROBAXIN) 500 mg in dextrose 5 % 50 mL IVPB  500 mg Intravenous Q6H PRN Patrecia Pace A, PA-C      . metoCLOPramide (REGLAN) 5 MG/ML injection           . morphine 2 MG/ML injection 0.5 mg  0.5 mg Intravenous Q2H PRN Delray Alt, PA-C      . multivitamin (PROSIGHT) tablet 1 tablet  1 tablet Oral Daily Delray Alt, PA-C   1 tablet at 05/21/20 1314  . ondansetron (ZOFRAN) injection 4 mg  4 mg Intravenous Q6H PRN Patrecia Pace A, PA-C   4 mg at 05/21/20 1317  . pantoprazole (PROTONIX) EC tablet 40 mg  40 mg Oral Daily Patrecia Pace A, PA-C   40 mg at 05/21/20 1314  . polyethylene glycol (MIRALAX / GLYCOLAX) packet 17 g  17 g Oral Daily PRN Patrecia Pace A, PA-C      . polyvinyl alcohol (LIQUIFILM TEARS) 1.4 % ophthalmic solution 1 drop  1 drop Both Eyes BID Patrecia Pace A, PA-C   1 drop at 05/20/20 2300  . senna-docusate (Senokot-S) tablet 1 tablet  1 tablet Oral QHS Delray Alt, PA-C   1 tablet at 05/20/20 2300  . triamcinolone (NASACORT) nasal inhaler 1 spray  1 spray Nasal BID Delray Alt, PA-C   1 spray at 05/20/20 2300     Discharge Medications: Please see discharge summary for a list of discharge medications.  Relevant Imaging Results:  Relevant Lab Results:   Additional Information SS#[id number] Page, [place]

## 2020-05-21 NOTE — Progress Notes (Signed)
Orthopedic Tech Progress Note Patient Details:  Katherine Benson [birth year] [id number] Called [employer] for hinged knee brace. Patient ID: Katherine Benson, female   DOB: March 27, 1941, 79 y.o.   MRN: [id number]   Katherine Benson 05/21/2020, 2:16 PM

## 2020-05-21 NOTE — Transfer of Care (Signed)
Immediate Anesthesia Transfer of Care Note  Patient: Katherine Benson  Procedure(s) Performed: LEFT OPEN REDUCTION INTERNAL FIXATION (ORIF) TIBIAL PLATEAU (Left )  Patient Location: PACU  Anesthesia Type:General  Level of Consciousness: awake  Airway & Oxygen Therapy: Patient Spontanous Breathing and Patient connected to face mask oxygen  Post-op Assessment: Report given to RN  Post vital signs: Reviewed and stable  Last Vitals:  Vitals Value Taken Time  BP 123/69 05/21/20 1034  Temp    Pulse 91 05/21/20 1036  Resp    SpO2 98 % 05/21/20 1036  Vitals shown include unvalidated device data.  Last Pain:  Vitals:   05/21/20 0624  TempSrc: Axillary  PainSc:       Patients Stated Pain Goal: 1 (45/84/83 5075)  Complications: No complications documented.

## 2020-05-21 NOTE — Anesthesia Procedure Notes (Signed)
Procedure Name: Intubation Date/Time: 05/21/2020 8:13 AM Performed by: Gaylene Brooks, CRNA Pre-anesthesia Checklist: Patient identified, Emergency Drugs available, Suction available and Patient being monitored Patient Re-evaluated:Patient Re-evaluated prior to induction Oxygen Delivery Method: Circle System Utilized Preoxygenation: Pre-oxygenation with 100% oxygen Induction Type: IV induction Ventilation: Mask ventilation without difficulty Laryngoscope Size: Miller and 2 Grade View: Grade I Tube type: Oral Tube size: 7.0 mm Number of attempts: 1 Airway Equipment and Method: Stylet and Oral airway Placement Confirmation: ETT inserted through vocal cords under direct vision,  positive ETCO2 and breath sounds checked- equal and bilateral Secured at: 23 cm Tube secured with: Tape Dental Injury: Teeth and Oropharynx as per pre-operative assessment

## 2020-05-21 NOTE — Op Note (Signed)
Orthopaedic Surgery Operative Note (CSN: [id number] ) Date of Surgery: 05/21/2020  Admit Date: 05/19/2020   Diagnoses: Pre-Op Diagnoses: Left bicondylar tibial plateau fracture   Post-Op Diagnosis: Same  Procedures: CPT [id number] reduction internal fixation of left tibial plateau fracture  Surgeons : Primary: Shona Needles, MD  Assistant: Patrecia Pace, PA-C  Location: OR 11   Anesthesia:General  Antibiotics: Ancef 2g preop with 1 gm vancomycin powder   Tourniquet time: Total Tourniquet Time Documented: Thigh (Left) - 73 minutes Total: Thigh (Left) - 73 minutes  Estimated Blood LNLG:92 mL  Complications:None   Specimens:None   Implants: Implant Name Type Inv. Item Serial No. Manufacturer Lot No. LRB No. Used Action  cancellous bone chips 20 cc     [id number] Left 1 Implanted  3.20mm VA-LCP Prox Tibia Plate Large [id number]    DEPUY SYNTHES [id number] Left 1 Implanted  SCREW HEADED ST 3.5X68 - [id number] Screw SCREW HEADED ST 3.5X68  DEPUY ORTHOPAEDICS  Left 1 Implanted  SCREW HEADED ST 3.5X36 - [id number] Screw SCREW HEADED ST 3.5X36  DEPUY ORTHOPAEDICS  Left 1 Implanted  SCREW HEADED ST 3.5X50 - [id number] Screw SCREW HEADED ST 3.5X50  DEPUY ORTHOPAEDICS  Left 1 Implanted  SCREW HEADED ST 3.5X75 - [id number] Screw SCREW HEADED ST 3.5X75  DEPUY ORTHOPAEDICS  Left 1 Implanted  SCREW CORT HEADED ST 3.5X32 - [id number] Screw SCREW CORT HEADED ST 3.5X32  DEPUY ORTHOPAEDICS  Left 1 Implanted  SCREW CORT HEADED ST 3.5X30 - [id number] Screw SCREW CORT HEADED ST 3.5X30  DEPUY ORTHOPAEDICS  Left 1 Implanted  SCREW HEADED ST 3.5X38 - [id number] Screw SCREW HEADED ST 3.5X38  DEPUY ORTHOPAEDICS  Left 1 Implanted  SCREW LOCKING VA 3.5X75MM - [id number] Screw SCREW LOCKING VA 3.5X75MM  DEPUY ORTHOPAEDICS  Left 1 Implanted  SCREW VA-LOCKING 65MM 3.5 - [id number] Screw SCREW VA-LOCKING 65MM 3.5  DEPUY ORTHOPAEDICS  Left 1 Implanted     Indications for Surgery: 79 year old female  who had a tibial plateau fracture that was apparently nondisplaced and she was followed as an outpatient with a another orthopedic surgeon within the [facility].  She presented after a fall at her nursing facility.  X-rays and CT scan that were obtained here at [facility] showed significant displacement of her articular surface.  Due to the significant displacement I felt that the injury was unstable and required open reduction internal fixation.  We discussed the risks and benefits with the patient's husband as well as her son.  Risks included but not limited to bleeding, infection, malunion, nonunion, hardware failure, hardware irritation, nerve or blood vessel injury, posttraumatic arthritis, knee stiffness, DVT, even the possibility anesthetic complications.  They agreed to proceed with surgery and consent was obtained.  Operative Findings: 1.  Unstable bicondylar tibial plateau fracture with significant valgus instability. 2.  Open reduction internal fixation of left tibial plateau fracture using Synthes VA 3.5 mm LCP proximal tibial locking plate and percutaneously placed anterior to posterior 3.5 millimeter screws for fixation of the posterior medial articular fragment.  Procedure: The patient was identified in the preoperative holding area. Consent was confirmed with the patient and their family and all questions were answered. The operative extremity was marked after confirmation with the patient. she was then brought back to the operating room by our anesthesia colleagues.  She was placed under anesthesia and carefully transferred over to a radiolucent flat top table.  A nonsterile tourniquet was placed to her upper thigh.  A bump was placed under her  operative hip.  The left lower extremity was prepped and draped in usual sterile fashion.  A timeout was performed to verify the patient, the procedure, and the extremity.  Preoperative antibiotics were dosed.  Stress examination  of the knee under fluoroscopy showed significant valgus instability as well as some posterior instability with anterior translation of the tibia.  An Esmarch was used to exsanguinate the extremity and tourniquet was inflated to 250 mmHg.  Total tourniquet time as noted above.  An anterior lateral approach was carried down through skin and subcutaneous tissue.  The IT band was incised in line with the incision.  The IT band was released off the proximal tibia and the interval between the capsule and the IT band was developed.  I then performed a sub-meniscal arthrotomy with a 15 blade.  I used Vicryl sutures to tag the capsule.  There was no meniscus tear.  From preoperative CT scan I was able to visualize an anterior split however it was minimally displaced and it was underlying the patella tendon.  As result I felt that a osteotomy was appropriate to access the posterior impaction of the lateral joint.  A 2.5 mm drill bit was used to create a osteotomy and an osteotome was used to complete it.  I was then able to access the joint and used a footed tamp to elevate the posterior articular surface.  I used crushed cancellous allograft to backfilled the defect.  I used lateral fluoroscopic imaging as well as intraoperative palpation of the joint to assess my reduction.  Once I had the lateral joint elevated I turned my attention to the posterior medial fragment.  On the CT scan there was some callus that had formed.  I made a small percutaneous incision along the posterior medial border of the tibia.  I was able to use a Cobb elevator to enter the fracture plane to loosen the fracture fragments.  I then used a reduction tenaculum to reduce the articular surface.  I used fluoroscopy as a guide and was able to get a near anatomic reduction.  I then placed a 1.6 mm K wires percutaneously from anterior to posterior to hold the reduction.  Once I had the posterior medial fragment provisionally fixed I then reduced the  lateral condyle to the medial condyle using a reduction tenaculum and placed a 3.5 millimeter screw to hold this provisionally.  I was able to remove the clamp and proceeded to place percutaneous 3.5 millimeter screws from anterior to posterior under fluoroscopic guidance.  I then removed the medial clamp and medial K wires.  I then chose a 6-hole Synthes 3.5 mm LCP proximal tibial locking plate.  I slid this submuscularly along the lateral cortex.  I held the proximal portion of the plate in the appropriate position with a 1.6 mm K wire.  I made sure that the plate was appropriate aligned on lateral view and then placed a 3.5 millimeter screw to bring the proximal portion of the plate flush to bone.  I then placed percutaneous 3.5 millimeter screws to bring the distal portion of plate flush to bone.  A total of 3 screws were placed in the tibial shaft.  I returned to the proximal segment and placed 2 locking screws posteriorly to raft the disimpacted articular surface.  Another nonlocking screw was placed into the metaphysis to reinforce the construct.  Final fluoroscopic imaging was obtained.  The incision was copiously irrigated.  The tourniquet was deflated.  A free needle  was used to bring the tag sutures for the capsule through the plate and tied these down.  A gram of vancomycin powder was placed into the incision.  A layer closure of 0 Vicryl, 2-0 Vicryl and 3-0 nylon was used to close the skin.  Sterile dressing was placed consisting of Mepitel, 4 x 4's, sterile cast padding and Ace wrap.  The patient was placed in a knee immobilizer and awoken from anesthesia and taken to the PACU in stable condition.  Post Op Plan/Instructions: The patient will be nonweightbearing to the left lower extremity.  We will transition her to a hinged knee brace locked in extension for 2 weeks.  We will then allow for some gentle range of motion of the knee.  We will start Lovenox for DVT prophylaxis.  She will receive  postoperative antibiotics.  We will have her mobilize with physical and Occupational Therapy.  She will likely discharge back to the SNF.  I was present and performed the entire surgery.  Patrecia Pace, PA-C did assist me throughout the case. An assistant was necessary given the difficulty in approach, maintenance of reduction and ability to instrument the fracture.   Katha Hamming, MD [facility]

## 2020-05-21 NOTE — Anesthesia Postprocedure Evaluation (Signed)
Anesthesia Post Note  Patient: Katherine Benson  Procedure(s) Performed: LEFT OPEN REDUCTION INTERNAL FIXATION (ORIF) TIBIAL PLATEAU (Left )     Patient location during evaluation: PACU Anesthesia Type: General Level of consciousness: awake Pain management: pain level controlled Vital Signs Assessment: post-procedure vital signs reviewed and stable Respiratory status: spontaneous breathing Cardiovascular status: stable Postop Assessment: no apparent nausea or vomiting Anesthetic complications: no   No complications documented.  Last Vitals:  Vitals:   05/21/20 1215 05/21/20 1249  BP: (!) 155/83 (!) 164/87  Pulse: 92 100  Resp: (!) 22 19  Temp: 36.9 C 36.8 C  SpO2: 99% 98%    Last Pain:  Vitals:   05/21/20 1100  TempSrc:   PainSc: 0-No pain                 Jaideep Pollack

## 2020-05-22 ENCOUNTER — Encounter (HOSPITAL_COMMUNITY): Payer: Self-pay | Admitting: Student

## 2020-05-22 LAB — BASIC METABOLIC PANEL
Anion gap: 8 (ref 5–15)
BUN: <5 mg/dL — ABNORMAL LOW (ref 8–23)
CO2: 27 mmol/L (ref 22–32)
Calcium: 7.9 mg/dL — ABNORMAL LOW (ref 8.9–10.3)
Chloride: 93 mmol/L — ABNORMAL LOW (ref 98–111)
Creatinine, Ser: 0.58 mg/dL (ref 0.44–1.00)
GFR calc Af Amer: >60 mL/min (ref >60)
GFR calc non Af Amer: >60 mL/min (ref >60)
Glucose, Bld: 101 mg/dL — ABNORMAL HIGH (ref 70–99)
Potassium: 3.6 mmol/L (ref 3.5–5.1)
Sodium: 128 mmol/L — ABNORMAL LOW (ref 135–145)

## 2020-05-22 LAB — CBC WITH DIFFERENTIAL/PLATELET
Abs Immature Granulocytes: 0.04 10*3/uL (ref 0.00–0.07)
Basophils Absolute: 0 10*3/uL (ref 0.0–0.1)
Basophils Relative: 0 %
Eosinophils Absolute: 0.1 10*3/uL (ref 0.0–0.5)
Eosinophils Relative: 1 %
HCT: 24.9 % — ABNORMAL LOW (ref 36.0–46.0)
Hemoglobin: 8.2 g/dL — ABNORMAL LOW (ref 12.0–15.0)
Immature Granulocytes: 1 %
Lymphocytes Relative: 9 %
Lymphs Abs: 0.6 10*3/uL — ABNORMAL LOW (ref 0.7–4.0)
MCH: 30 pg (ref 26.0–34.0)
MCHC: 32.9 g/dL (ref 30.0–36.0)
MCV: 91.2 fL (ref 80.0–100.0)
Monocytes Absolute: 0.6 10*3/uL (ref 0.1–1.0)
Monocytes Relative: 9 %
Neutro Abs: 5.1 10*3/uL (ref 1.7–7.7)
Neutrophils Relative %: 80 %
Platelets: 202 10*3/uL (ref 150–400)
RBC: 2.73 MIL/uL — ABNORMAL LOW (ref 3.87–5.11)
RDW: 12.8 % (ref 11.5–15.5)
WBC: 6.4 10*3/uL (ref 4.0–10.5)
nRBC: 0 % (ref 0.0–0.2)

## 2020-05-22 LAB — VITAMIN D 25 HYDROXY (VIT D DEFICIENCY, FRACTURES): Vit D, 25-Hydroxy: 24.66 ng/mL — ABNORMAL LOW (ref 30–100)

## 2020-05-22 MED ORDER — ENOXAPARIN SODIUM 40 MG/0.4ML ~~LOC~~ SOLN: 40.0000 mg | SUBCUTANEOUS | 0 refills | Status: DC

## 2020-05-22 MED ORDER — MELATONIN 3 MG PO TABS
3.0000 mg | ORAL_TABLET | Freq: Every evening | ORAL | Status: DC | PRN
  Administered 2020-05-22 – 2020-06-04 (×6): 3 mg via ORAL
  Filled 2020-05-22 (×6): qty 1

## 2020-05-22 MED ORDER — HYDROCODONE-ACETAMINOPHEN 5-325 MG PO TABS
1.0000 | ORAL_TABLET | Freq: Four times a day (QID) | ORAL | 0 refills | Status: DC | PRN
Start: 2020-05-22 — End: 2020-06-03

## 2020-05-22 NOTE — Progress Notes (Signed)
Orthopaedic Trauma Progress Note  S: Doing okay, pain manageable. Feels a little loopy right now. Tried to get up with PT earlier but got dizzy and had drop in Sp02.  About to get a bath now  O:  Vitals:   05/22/20 0453 05/22/20 0757  BP: (!) 143/74 (!) 158/80  Pulse: 100 90  Resp: 18 18  Temp: 98.1 F (36.7 C) 99 F (37.2 C)  SpO2: 95% (!) 89%    General: Sitting up in bed, NAD Respiratory:  No increased work of breathing.  Extremity: Hinge brace in place, locked in extension. Dressing C/D/I. Limited ankle motion. Wiggles toes. Endorses sensation to light touch distally. + DP pulse  Imaging: Stable post op imaging.   Labs:  Results for orders placed or performed during the hospital encounter of 05/19/20 (from the past 24 hour(s))  Glucose, capillary     Status: Abnormal   Collection Time: 05/21/20 10:35 AM  Result Value Ref Range   Glucose-Capillary 119 (H) 70 - 99 mg/dL   Comment 1 Notify RN    Comment 2 Document in Chart   Basic metabolic panel     Status: Abnormal   Collection Time: 05/22/20  4:24 AM  Result Value Ref Range   Sodium 128 (L) 135 - 145 mmol/L   Potassium 3.6 3.5 - 5.1 mmol/L   Chloride 93 (L) 98 - 111 mmol/L   CO2 27 22 - 32 mmol/L   Glucose, Bld 101 (H) 70 - 99 mg/dL   BUN <5 (L) 8 - 23 mg/dL   Creatinine, Ser 0.58 0.44 - 1.00 mg/dL   Calcium 7.9 (L) 8.9 - 10.3 mg/dL   GFR calc non Af Amer >60 >60 mL/min   GFR calc Af Amer >60 >60 mL/min   Anion gap 8 5 - 15  CBC with Differential/Platelet     Status: Abnormal   Collection Time: 05/22/20  4:24 AM  Result Value Ref Range   WBC 6.4 4.0 - 10.5 K/uL   RBC 2.73 (L) 3.87 - 5.11 MIL/uL   Hemoglobin 8.2 (L) 12.0 - 15.0 g/dL   HCT 24.9 (L) 36 - 46 %   MCV 91.2 80.0 - 100.0 fL   MCH 30.0 26.0 - 34.0 pg   MCHC 32.9 30.0 - 36.0 g/dL   RDW 12.8 11.5 - 15.5 %   Platelets 202 150 - 400 K/uL   nRBC 0.0 0.0 - 0.2 %   Neutrophils Relative % 80 %   Neutro Abs 5.1 1.7 - 7.7 K/uL   Lymphocytes Relative 9 %    Lymphs Abs 0.6 (L) 0.7 - 4.0 K/uL   Monocytes Relative 9 %   Monocytes Absolute 0.6 0 - 1 K/uL   Eosinophils Relative 1 %   Eosinophils Absolute 0.1 0 - 0 K/uL   Basophils Relative 0 %   Basophils Absolute 0.0 0 - 0 K/uL   Immature Granulocytes 1 %   Abs Immature Granulocytes 0.04 0.00 - 0.07 K/uL  VITAMIN D 25 Hydroxy (Vit-D Deficiency, Fractures)     Status: Abnormal   Collection Time: 05/22/20  4:24 AM  Result Value Ref Range   Vit D, 25-Hydroxy 24.66 (L) 30 - 100 ng/mL    Assessment: 79 year old female s/p fall, 1 Day Post-Op   Injuries:  Left bicondylar tibial plateau fracture s/p ORIF  Weightbearing: NWB LLE  Insicional and dressing care:  Change dressing tomorrow  Showering:  Okay to shower with assistance starting 05/24/20 if no drainage from  incisions  Orthopedic device(s): Hinge knee brace LLE.  Should remain locked in full extension  CV/Blood loss: Acute blood loss anemia, Hgb 8.2 this morning.   Pain management:  1. Tylenol 325-650 mg q 6 hours scheduled 2. Robaxin 500 mg q 6 hours PRN 3. Norco 5-325 mg q 6 hours PRN 4. Morphine 0.5 mg q 2 hours PRN  VTE prophylaxis: Lovenox, SCDs  ID: Clindamycin post op  Foley/Lines:  No foley, KVO IVFs  Medical co-morbidities: MGUS; Crohn's; DM; and HTN   Impediments to Fracture Healing: Vit D level 24, start on D3 supplementation  Dispo: Therapies as tolerated. Will have OT farbricate footplate to help maintain dorsiflexion of ankle. Ok to return to SNF from ortho standpoint once cleared by therapies and medicine team  Follow - up plan: 2 weeks  Contact information:  Katha Hamming MD, Patrecia Pace PA-C   Andra Matsuo A. Carmie Kanner [facility] [phone] (office) [email or web address]

## 2020-05-22 NOTE — Progress Notes (Signed)
PROGRESS NOTE  Katherine Benson  DOB: [birth year]  PCP: Heber [place], MD ZDG:[id number]  DOA: 05/19/2020  LOS: 2 days   Chief Complaint  Patient presents with  . post CPR  . Fall   Brief narrative: Katherine Benson is a 79 y.o. female with PMH of DM2, HTN, Crohn's disease, MGUS who was recently hospitalized in [place] for a L tibial plateau fracture and discharged to rehab for nonoperative management. On 9/7, she had another fall at rehab. EMS attended. Unaware of her extreme sensitivity to opioid, she was given IV fentanyl after which she required a brief period of CPR.    In the ER, patient was lethargic, revived with Narcan. CT scan of the left lower extremity showed comminuted fractures of the proximal tibia involving the lateral > medial tibial plateau; depression of the articular surface and impaction at the fracture line Admitted for further evaluation and management.  Subjective: Patient was seen and examined this afternoon. Lying in bed.  Not in distress. Pain controlled. Seen by PT today. Family not at bedside.  Assessment/Plan: Left tibial plateau fracture -Secondary to a fall. -Imaging findings as above showed comminuted fracture. -9/9, underwent ORIF of left tibia fracture by orthopedics. -Noted a plan of hinged knee brace locked in extension for 2 weeks.  Gentle range of motion after that. -Nonweightbearing status recommended -Lovenox for DVT prophylaxis per orthopedics -Continue pain control.  Avoid IV opioids because of her history of high sensitivity to it.  Elevated troponin -High-sensitivity troponin was 71 > 48 > 33 -Likely secondary to CPR, down trended overall -No cardiac symptoms.  Echocardiogram with EF 50 to 55%.  No wall motion abnormality. -Continue aspirin.  Progressive generalized weakness -Patient recently was hospitalized at [facility] sustaining left tibial fracture. She was at [facility] for physical therapy and nonoperative management.   Per family, she has been having progressive generalized weakness. -PT evaluation obtained.  SNF recommended  Acute opiate overdose -Patient reportedly is highly sensitive to opioids.  Unaware of that EMS gave her IV fentanyl after which she became unresponsive.  She was revived after a dose of Narcan and about 4 minutes of CPR.  -Avoid IV opioids.  Acute metabolic encephalopathy -Mental status was acutely impaired on admission because of fentanyl overdose. -Improving. Still remains confused but family states this has been her baseline mental status last few weeks after first fracture.  -Patient probably has underlying dementia based on her age and risk factors.  -Continue to monitor.   Hyponatremia -Likely secondary to poor oral intake.   -Improving on normal saline.  But patient is at risk of getting fluid overloaded.  Will stop IV fluid today and continue to monitor sodium level. Recent Labs  Lab 05/19/20 0504 05/20/20 0448 05/21/20 0447 05/22/20 0424  NA 126* 126* 130* 128*   Essential hypertension -Continue Diltiazem.  IV hydralazine as needed  MGUS -Last seen in clinic on 11/05/19 -Surveillance only  Crohn's Disease -Declines medications for this issue -On Omeprazole for GERD and Bentyl for IBS -Last seen by Gi on 09/25/19  DM Lab Results  Component Value Date   HGBA1C 6.1 (H) 05/20/2020   -Diet controlled -Last seen by endocrinology on 12/06/19  Other -Continue Tegretol, Lexapro - possibly associated with depression and cognitive impairment but no recent obvious notes about why she is taking these medications  Mobility: PT eval post procedure Code Status:   Code Status: Full Code  Nutritional status: Body mass index is 30.44 kg/m.     Diet  Order            Diet Heart Room service appropriate? No; Fluid consistency: Thin  Diet effective now                 DVT prophylaxis: enoxaparin (LOVENOX) injection 40 mg Start: 05/22/20 0900 Foot Pump /  plexipulse Start: 05/19/20 0817 SCDs Start: 05/19/20 0815   Antimicrobials:  None Fluid: NS at 50 mill per hour Consultants: Orthopedics Family Communication: called and updated patient's son Mr. Tommy Attia this afternoon.  Status is: Inpatient  Remains inpatient appropriate because -needs SNF Dispo: The patient is from: Home              Anticipated d/c is to: Likely SNF              Anticipated d/c date is: 2 to 3 days              Patient currently is not medically stable to d/c.   Infusions:  . methocarbamol (ROBAXIN) IV      Scheduled Meds: . aspirin  325 mg Oral Daily  . carbamazepine  200 mg Oral BID  . dicyclomine  10 mg Oral BID AC & HS  . diltiazem  180 mg Oral QHS  . docusate sodium  100 mg Oral BID  . enoxaparin (LOVENOX) injection  40 mg Subcutaneous Q24H  . escitalopram  10 mg Oral Daily  . loratadine  10 mg Oral Daily  . multivitamin  1 tablet Oral Daily  . pantoprazole  40 mg Oral Daily  . polyvinyl alcohol  1 drop Both Eyes BID  . senna-docusate  1 tablet Oral QHS  . triamcinolone  1 spray Nasal BID    Antimicrobials: Anti-infectives (From admission, onward)   Start     Dose/Rate Route Frequency Ordered Stop   05/21/20 1630  clindamycin (CLEOCIN) IVPB 900 mg       Note to Pharmacy: Please give 8 hours from intraoperative dose   900 mg 100 mL/hr over 30 Minutes Intravenous Every 8 hours 05/21/20 1247 05/22/20 0921   05/21/20 1003  vancomycin (VANCOCIN) powder  Status:  Discontinued          As needed 05/21/20 1003 05/21/20 1031      PRN meds: acetaminophen, bisacodyl, hydrALAZINE, HYDROcodone-acetaminophen, methocarbamol **OR** methocarbamol (ROBAXIN) IV, morphine injection, ondansetron (ZOFRAN) IV, polyethylene glycol   Objective: Vitals:   05/22/20 0850 05/22/20 1200  BP:    Pulse:    Resp:    Temp:    SpO2: 93% 94%    Intake/Output Summary (Last 24 hours) at 05/22/2020 1456 Last data filed at 05/22/2020 1000 Gross per 24 hour  Intake  1314.15 ml  Output 1150 ml  Net 164.15 ml   Filed Weights   05/21/20 1215  Weight: 70.7 kg   Weight change:  Body mass index is 30.44 kg/m.   Physical Exam: General exam: Appears calm and comfortable. Not in physical distress Skin: No rashes, lesions or ulcers. HEENT: Atraumatic, normocephalic, supple neck, no obvious bleeding Lungs: Clear to auscultation bilaterally CVS: Regular rate and rhythm, no murmur GI/Abd soft, nontender, nondistended, bowel sound present CNS: Alert, awake, oriented to place and person.  Slow to respond.  Not restless or agitated  psychiatry: Normal mood.   Extremities: No pedal edema, no calf tenderness.  Bandages left leg post procedure  Data Review: I have personally reviewed the laboratory data and studies available.  Recent Labs  Lab 05/19/20 0504 05/20/20 0448 05/21/20 0447  05/22/20 0424  WBC 16.2* 7.6 6.0 6.4  NEUTROABS 15.0*  --  4.6 5.1  HGB 10.9* 9.8* 8.4* 8.2*  HCT 32.1* 29.3* 26.0* 24.9*  MCV 90.9 91.6 93.2 91.2  PLT 313 248 200 202   Recent Labs  Lab 05/19/20 0504 05/20/20 0448 05/21/20 0447 05/22/20 0424  NA 126* 126* 130* 128*  K 3.4* 3.6 3.4* 3.6  CL 89* 92* 96* 93*  CO2 27 26 27 27   GLUCOSE 147* 110* 95 101*  BUN 8 <5* <5* <5*  CREATININE 0.68 0.49 0.67 0.58  CALCIUM 8.7* 8.5* 7.9* 7.9*   Signed, Terrilee Croak, MD [facility] 05/22/2020

## 2020-05-22 NOTE — Evaluation (Signed)
Occupational Therapy Evaluation Patient Details Name: Katherine Benson MRN: [id number] DOB: July 29, 1941 Today's Date: 05/22/2020    History of Present Illness Patient is a 79 y/o female who presents after fall at rehab, + CPR after given opiod and s/p narcan. CT-comminuted fx of left proximal tibia, lateral>medial tibial plateau now s/p ORIF 9/9. PMH includes recently hospitalization in [place] for a Lft tibial plateau fracture and discharged to rehab, MGUS, HTN, DM.   Clinical Impression   PTA pt at SNF after recent L tibial fx. Prior to that admission, pt reports being independent. At time of eval, pt completed bed mobility with max A +2. Pt able to sustain sitting ~5 mins. She reports dizziness and nausea sitting EOB. BP stable. Pt was taken off supplemental O2 for trial period through transfer. SpO2 sats reading 63% EOB- 3L [place] applied for recovery, able to be titrated back down to 2L. RN notified. Noted pt had cognitive deficits in orientation, safety, awareness, and command following. Recommend pt return to SNF given current status. Will continue to follow per POC listed below.    Follow Up Recommendations  SNF    Equipment Recommendations  None recommended by OT    Recommendations for Other Services       Precautions / Restrictions Precautions Precautions: Fall;Other (comment) Precaution Comments: watch O2 Required Braces or Orthoses: Other Brace Other Brace: Bledsoe brace LLE locked into knee extension Restrictions Weight Bearing Restrictions: Yes RLE Weight Bearing: Non weight bearing Other Position/Activity Restrictions: NWB LLE      Mobility Bed Mobility Overal bed mobility: Needs Assistance Bed Mobility: Supine to Sit;Sit to Supine     Supine to sit: Max assist;+2 for physical assistance;HOB elevated Sit to supine: Max assist;+2 for physical assistance;HOB elevated   General bed mobility comments: Assist with LLE, trunk and scooting bottom to EOB, cues to reach  for rail to assist.  Transfers                 General transfer comment: Deferred.    Balance Overall balance assessment: Needs assistance;History of Falls Sitting-balance support: Feet supported;Single extremity supported Sitting balance-Leahy Scale: Poor Sitting balance - Comments: requires 1-2 UE support and LLE supported on trash can; reports dizziness. Sp02 67% on RA, donned 3L 02 to recover.                                   ADL either performed or assessed with clinical judgement   ADL Overall ADL's : Needs assistance/impaired Eating/Feeding: Minimal assistance;Bed level   Grooming: Minimal assistance;Bed level   Upper Body Bathing: Maximal assistance;Bed level   Lower Body Bathing: Total assistance;Bed level   Upper Body Dressing : Maximal assistance;Bed level   Lower Body Dressing: Total assistance;Bed level   Toilet Transfer: Total assistance   Toileting- Clothing Manipulation and Hygiene: Total assistance   Tub/ Shower Transfer: Total assistance   Functional mobility during ADLs: Maximal assistance;+2 for physical assistance;+2 for safety/equipment (EOB only)       Vision Patient Visual Report: No change from baseline       Perception     Praxis      Pertinent Vitals/Pain Pain Assessment: Faces Faces Pain Scale: Hurts little more Pain Location: LLE with movement Pain Descriptors / Indicators: Grimacing;Guarding Pain Intervention(s): Limited activity within patient's tolerance;Monitored during session;Repositioned     Hand Dominance     Extremity/Trunk Assessment Upper Extremity Assessment Upper Extremity Assessment: Generalized  weakness   Lower Extremity Assessment Lower Extremity Assessment: Defer to PT evaluation LLE Deficits / Details: Able to wiggle toes; limited ankle AROM, not able to get to neutral. LLE Sensation: WNL       Communication Communication Communication: No difficulties   Cognition  Arousal/Alertness: Lethargic;Suspect due to medications Behavior During Therapy: Anxious;Flat affect Overall Cognitive Status: Impaired/Different from baseline Area of Impairment: Orientation;Memory;Attention;Following commands;Problem solving;Awareness                 Orientation Level: Disoriented to;Time Current Attention Level: Focused Memory: Decreased short-term memory;Decreased recall of precautions Following Commands: Follows one step commands with increased time;Follows multi-step commands inconsistently   Awareness: Intellectual Problem Solving: Slow processing;Decreased initiation;Requires verbal cues;Requires tactile cues;Difficulty sequencing General Comments: Able to state correct month with 2 options but not year, "I just don;t know right now." Knows she is in the hospital and had surgery. Confused. Not able to recall WB status halfway through session despite being told at beginning of session.   General Comments  "please don't make me get up." Sp02 dropped to 67% on RA with sitting EOB, donned 3L 02 and provided cues for pursed lip breathing which pt was unable to follow. Recovered to mid 19s and turned 02 down to 2L. RN made aware.    Exercises     Shoulder Instructions      Home Living Family/patient expects to be discharged to:: Skilled nursing facility Living Arrangements: Spouse/significant other Available Help at Discharge: Family;Available 24 hours/day Type of Home: House Home Access: Stairs to enter [employer] of Steps: several Entrance Stairs-Rails: Right Home Layout: One level     Bathroom Shower/Tub: Tub/shower unit;Curtain   Bathroom Toilet: Handicapped height     Home Equipment: Cane - single point          Prior Functioning/Environment Level of Independence: Needs assistance    ADL's / Homemaking Assistance Needed: recently in rehab- needing assist for all ADLs   Comments: Drives, does ADLs and IADLs independently,  furniture walks at times, no falls beside this one. This was prior to first fall. Pt in rehab prior to this admission. Not sure what pt was able to do at rehab.        OT Problem List: Decreased strength;Decreased knowledge of use of DME or AE;Decreased knowledge of precautions;Decreased activity tolerance;Decreased cognition;Cardiopulmonary status limiting activity;Impaired balance (sitting and/or standing);Decreased safety awareness;Pain      OT Treatment/Interventions: Self-care/ADL training;Therapeutic exercise;Patient/family education;Balance training;Energy conservation;Therapeutic activities;DME and/or AE instruction    OT Goals(Current goals can be found in the care plan section) Acute Rehab OT Goals Patient Stated Goal: "to lay back down" OT Goal Formulation: With patient Time For Goal Achievement: 06/05/20 Potential to Achieve Goals: Good  OT Frequency: Min 2X/week   Barriers to D/C:            Co-evaluation PT/OT/SLP Co-Evaluation/Treatment: Yes Reason for Co-Treatment: Necessary to address cognition/behavior during functional activity;Complexity of the patient's impairments (multi-system involvement);For patient/therapist safety;To address functional/ADL transfers   OT goals addressed during session: ADL's and self-care;Strengthening/ROM      AM-PAC OT "6 Clicks" Daily Activity     Outcome Measure Help from another person eating meals?: A Little Help from another person taking care of personal grooming?: A Little Help from another person toileting, which includes using toliet, bedpan, or urinal?: Total Help from another person bathing (including washing, rinsing, drying)?: Total Help from another person to put on and taking off regular upper body clothing?: A Lot Help  from another person to put on and taking off regular lower body clothing?: Total 6 Click Score: 11   End of Session Equipment Utilized During Treatment: Oxygen Nurse Communication: Mobility  status  Activity Tolerance: Patient tolerated treatment well Patient left: in bed;with call bell/phone within reach;with bed alarm set  OT Visit Diagnosis: Unsteadiness on feet (R26.81);Other abnormalities of gait and mobility (R26.89);Muscle weakness (generalized) (M62.81);Other symptoms and signs involving cognitive function                Time: 0955-1020 OT Time Calculation (min): 25 min Charges:  OT General Charges $OT Visit: 1 Visit OT Evaluation $OT Eval Moderate Complexity: [place], MSOT, OTR/L [facility] Office Number: [phone] Pager: [phone]  Zenovia Jarred 05/22/2020, 2:15 PM

## 2020-05-22 NOTE — TOC Progression Note (Signed)
Transition of Care [facility]) - Progression Note    Patient Details  Name: Richardine Peppers MRN: [id number] Date of Birth: 11-Oct-1940  Transition of Care [facility]) CM/SW [place], Dana Phone Number: 05/22/2020, 5:22 PM  Clinical Narrative:    CSW spoke with Luellen Pucker in admissions at [facility]. Pt can return under [facility] waiver when medically stable. Will need new COVID swab 24-48 hrs before d/c.    Expected Discharge Plan: [place] Barriers to Discharge: Continued Medical Work up, Orthoptist and Services Expected Discharge Plan: [place] In-house Referral: Clinical Social Work Discharge Planning Services: [employer] Consult Post Acute Care Choice: [place], Resumption of Svcs/PTA Provider Living arrangements for the past 2 months: [place], [place]  Readmission Risk Interventions No flowsheet data found.

## 2020-05-22 NOTE — Evaluation (Signed)
Physical Therapy Evaluation Patient Details Name: Katherine Benson MRN: [id number] DOB: 03-23-1941 Today's Date: 05/22/2020   History of Present Illness  Patient is a 79 y/o female who presents after fall at rehab, + CPR after given opiod and s/p narcan. CT-comminuted fx of left proximal tibia, lateral>medial tibial plateau now s/p ORIF 9/9. PMH includes recently hospitalization in [place] for a Lft tibial plateau fracture and discharged to rehab, MGUS, HTN, DM.  Clinical Impression  Patient presents with post surgical deficits LLE, pain, impaired balance, decreased activity tolerance and impaired mobility s/p above. Pt reports being very nervous about mobility. Pt appears somewhat confused; also noted to have cognitive deficits related to attention, following commands, awareness, orientation and memory. Requires Max A of 2 for all mobility. Able to sit EOB ~10 minutes with support. Sp02 dropped to 67% on RA, reporting dizziness. Donned 02. RN aware. Would benefit from foot plate on LLE to help improve ankle AROM and return to SNF to maximize independence and mobility prior to return home. Will follow acutely.    Follow Up Recommendations SNF;Supervision for mobility/OOB;Supervision/Assistance - 24 hour    Equipment Recommendations  None recommended by PT    Recommendations for Other Services       Precautions / Restrictions Precautions Precautions: Fall;Other (comment) Precaution Comments: watch 02 Required Braces or Orthoses: Other Brace Other Brace: Bledsoe brace LLE locked into knee extension Restrictions Weight Bearing Restrictions: Yes Other Position/Activity Restrictions: NWb LLE      Mobility  Bed Mobility Overal bed mobility: Needs Assistance Bed Mobility: Supine to Sit;Sit to Supine     Supine to sit: Max assist;+2 for physical assistance;HOB elevated Sit to supine: Max assist;+2 for physical assistance;HOB elevated   General bed mobility comments: Assist with LLE,  trunk and scooting bottom to EOB, cues to reach for rail to assist.  Transfers                 General transfer comment: Deferred.  Ambulation/Gait                Stairs            Wheelchair Mobility    Modified Rankin (Stroke Patients Only)       Balance Overall balance assessment: Needs assistance;History of Falls Sitting-balance support: Feet supported;Single extremity supported Sitting balance-Leahy Scale: Poor Sitting balance - Comments: requires 1-2 UE support and LLE supported on trash can; reports dizziness. Sp02 67% on RA, donned 3L 02 to recover.                                     Pertinent Vitals/Pain Pain Assessment: Faces Faces Pain Scale: Hurts little more Pain Location: LLE with movement Pain Descriptors / Indicators: Grimacing;Guarding Pain Intervention(s): Monitored during session;Limited activity within patient's tolerance;Repositioned    Home Living Family/patient expects to be discharged to:: Skilled nursing facility Living Arrangements: Spouse/significant other Available Help at Discharge: Family;Available 24 hours/day Type of Home: House Home Access: Stairs to enter Entrance Stairs-Rails: Right Entrance Stairs-Number of Steps: several Home Layout: One level Home Equipment: Cane - single point      Prior Function Level of Independence: Independent         Comments: Drives, does ADLs and IADLs independently, furniture walks at times, no falls beside this one. This was prior to first fall. Pt in rehab prior to this admission. Not sure what pt was able to do at  rehab.     Hand Dominance   Dominant Hand: Right    Extremity/Trunk Assessment   Upper Extremity Assessment Upper Extremity Assessment: Defer to OT evaluation    Lower Extremity Assessment Lower Extremity Assessment: LLE deficits/detail;Generalized weakness LLE Deficits / Details: Able to wiggle toes; limited ankle AROM, not able to get to  neutral. LLE Sensation: WNL       Communication   Communication: No difficulties  Cognition Arousal/Alertness: Lethargic;Suspect due to medications Behavior During Therapy: Anxious;Flat affect ("I am nervous") Overall Cognitive Status: Impaired/Different from baseline Area of Impairment: Orientation;Memory;Attention;Following commands;Problem solving;Awareness                 Orientation Level: Disoriented to;Time Current Attention Level: Focused Memory: Decreased short-term memory;Decreased recall of precautions Following Commands: Follows one step commands with increased time;Follows multi-step commands inconsistently (repetition needed)   Awareness: Intellectual Problem Solving: Slow processing;Decreased initiation;Requires verbal cues;Requires tactile cues;Difficulty sequencing General Comments: Able to state correct month with 2 options but not year, "I just don;t know right now." Knows she is in the hospital and had surgery. Confused. Not able to recall WB status halfway through session despite being told at beginning of session.      General Comments General comments (skin integrity, edema, etc.): "please don't make me get up." Sp02 dropped to 67% on RA with sitting EOB, donned 3L 02 and provided cues for pursed lip breathing which pt was unable to follow. Recovered to mid 21s and turned 02 down to 2L. RN made aware.    Exercises     Assessment/Plan    PT Assessment Patient needs continued PT services  PT Problem List Decreased strength;Decreased mobility;Decreased safety awareness;Decreased range of motion;Decreased activity tolerance;Decreased cognition;Cardiopulmonary status limiting activity;Decreased skin integrity;Pain;Decreased balance       PT Treatment Interventions DME instruction;Therapeutic activities;Patient/family education;Balance training;Functional mobility training;Therapeutic exercise;Cognitive remediation;Gait training;Wheelchair mobility training     PT Goals (Current goals can be found in the Care Plan section)  Acute Rehab PT Goals Patient Stated Goal: "to lay back down" PT Goal Formulation: With patient Time For Goal Achievement: 06/05/20 Potential to Achieve Goals: Fair    Frequency Min 3X/week   Barriers to discharge        Co-evaluation PT/OT/SLP Co-Evaluation/Treatment: Yes Reason for Co-Treatment: Necessary to address cognition/behavior during functional activity;To address functional/ADL transfers;For patient/therapist safety;Complexity of the patient's impairments (multi-system involvement) PT goals addressed during session: Mobility/safety with mobility;Balance;Strengthening/ROM         AM-PAC PT "6 Clicks" Mobility  Outcome Measure Help needed turning from your back to your side while in a flat bed without using bedrails?: Total Help needed moving from lying on your back to sitting on the side of a flat bed without using bedrails?: Total Help needed moving to and from a bed to a chair (including a wheelchair)?: Total Help needed standing up from a chair using your arms (e.g., wheelchair or bedside chair)?: Total Help needed to walk in hospital room?: Total Help needed climbing 3-5 steps with a railing? : Total 6 Click Score: 6    End of Session Equipment Utilized During Treatment: Oxygen Activity Tolerance: Treatment limited secondary to medical complications (Comment);Other (comment) (drop in Sp02, highly nervous) Patient left: in bed;with call bell/phone within reach;with SCD's reapplied;with bed alarm set Nurse Communication: Mobility status;Need for lift equipment PT Visit Diagnosis: Pain;Muscle weakness (generalized) (M62.81);Repeated falls (R29.6);Dizziness and giddiness (R42) Pain - Right/Left: Left Pain - part of body: Leg    Time: 3810-1751 PT Time  Calculation (min) (ACUTE ONLY): 25 min   Charges:   PT Evaluation $PT Eval Moderate Complexity: 1 Mod          Marisa Severin, PT, DPT Acute  Rehabilitation Services Pager [phone] Office [phone]      Marguarite Arbour A Sabra Heck 05/22/2020, 12:40 PM

## 2020-05-22 NOTE — Social Work (Signed)
CSW has left message with [facility] staff to get plan in place for when pt medically stable with the weekend coming up.  CSW continuing to follow for support with disposition when medically appropriate.  Westley Hummer, MSW, [place] Work

## 2020-05-22 NOTE — Discharge Instructions (Signed)
Orthopaedic Trauma Service Discharge Instructions   General Discharge Instructions  WEIGHT BEARING STATUS: Non-weightbearing left lower extremity  RANGE OF MOTION/ACTIVITY: Keep hinge knee brace in place on left leg at all time except when showering. Knee brace should remain locked in full extension  Wound Care: Incisions can be left open to air if there is no drainage. If incision continues to have drainage, follow wound care instructions below. Okay to shower if no drainage from incisions.  DVT/PE prophylaxis: Lovenox  Diet: as you were eating previously.  Can use over the counter stool softeners and bowel preparations, such as Miralax, to help with bowel movements.  Narcotics can be constipating.  Be sure to drink plenty of fluids  PAIN MEDICATION USE AND EXPECTATIONS  You have likely been given narcotic medications to help control your pain.  After a traumatic event that results in an fracture (broken bone) with or without surgery, it is ok to use narcotic pain medications to help control one's pain.  We understand that everyone responds to pain differently and each individual patient will be evaluated on a regular basis for the continued need for narcotic medications. Ideally, narcotic medication use should last no more than 6-8 weeks (coinciding with fracture healing).   As a patient it is your responsibility as well to monitor narcotic medication use and report the amount and frequency you use these medications when you come to your office visit.   We would also advise that if you are using narcotic medications, you should take a dose prior to therapy to maximize you participation.  IF YOU ARE ON NARCOTIC MEDICATIONS IT IS NOT PERMISSIBLE TO OPERATE A MOTOR VEHICLE (MOTORCYCLE/CAR/TRUCK/MOPED) OR HEAVY MACHINERY DO NOT MIX NARCOTICS WITH OTHER CNS (CENTRAL NERVOUS SYSTEM) DEPRESSANTS SUCH AS ALCOHOL   STOP SMOKING OR USING NICOTINE PRODUCTS!!!!  As discussed nicotine severely  impairs your body's ability to heal surgical and traumatic wounds but also impairs bone healing.  Wounds and bone heal by forming microscopic blood vessels (angiogenesis) and nicotine is a vasoconstrictor (essentially, shrinks blood vessels).  Therefore, if vasoconstriction occurs to these microscopic blood vessels they essentially disappear and are unable to deliver necessary nutrients to the healing tissue.  This is one modifiable factor that you can do to dramatically increase your chances of healing your injury.    (This means no smoking, no nicotine gum, patches, etc)  DO NOT USE NONSTEROIDAL ANTI-INFLAMMATORY DRUGS (NSAID'S)  Using products such as Advil (ibuprofen), Aleve (naproxen), Motrin (ibuprofen) for additional pain control during fracture healing can delay and/or prevent the healing response.  If you would like to take over the counter (OTC) medication, Tylenol (acetaminophen) is ok.  However, some narcotic medications that are given for pain control contain acetaminophen as well. Therefore, you should not exceed more than 4000 mg of tylenol in a day if you do not have liver disease.  Also note that there are may OTC medicines, such as cold medicines and allergy medicines that my contain tylenol as well.  If you have any questions about medications and/or interactions please ask your doctor/PA or your pharmacist.      ICE AND ELEVATE INJURED/OPERATIVE EXTREMITY  Using ice and elevating the injured extremity above your heart can help with swelling and pain control.  Icing in a pulsatile fashion, such as 20 minutes on and 20 minutes off, can be followed.    Do not place ice directly on skin. Make sure there is a barrier between to skin and the  ice pack.    Using frozen items such as frozen peas works well as the conform nicely to the are that needs to be iced.  USE AN ACE WRAP OR TED HOSE FOR SWELLING CONTROL  In addition to icing and elevation, Ace wraps or TED hose are used to help limit  and resolve swelling.  It is recommended to use Ace wraps or TED hose until you are informed to stop.    When using Ace Wraps start the wrapping distally (farthest away from the body) and wrap proximally (closer to the body)   Example: If you had surgery on your leg or thing and you do not have a splint on, start the ace wrap at the toes and work your way up to the thigh        If you had surgery on your upper extremity and do not have a splint on, start the ace wrap at your fingers and work your way up to the upper arm   [place]: [phone]   VISIT OUR WEBSITE FOR ADDITIONAL INFORMATION: [email or web address]     Discharge Wound Care Instructions  Do NOT apply any ointments, solutions or lotions to pin sites or surgical wounds.  These prevent needed drainage and even though solutions like hydrogen peroxide kill bacteria, they also damage cells lining the pin sites that help fight infection.  Applying lotions or ointments can keep the wounds moist and can cause them to breakdown and open up as well. This can increase the risk for infection. When in doubt call the office.  Surgical incisions should be dressed daily.  If any drainage is noted, use one layer of adaptic, then gauze, Kerlix, and an ace wrap.  Once the incision is completely dry and without drainage, it may be left open to air out.  Showering may begin 36-48 hours later.  Cleaning gently with soap and water.  Traumatic wounds should be dressed daily as well.    One layer of adaptic, gauze, Kerlix, then ace wrap.  The adaptic can be discontinued once the draining has ceased    If you have a wet to dry dressing: wet the gauze with saline the squeeze as much saline out so the gauze is moist (not soaking wet), place moistened gauze over wound, then place a dry gauze over the moist one, followed by Kerlix wrap, then ace wrap.

## 2020-05-23 LAB — CBC WITH DIFFERENTIAL/PLATELET
Abs Immature Granulocytes: 0.04 10*3/uL (ref 0.00–0.07)
Basophils Absolute: 0 10*3/uL (ref 0.0–0.1)
Basophils Relative: 1 %
Eosinophils Absolute: 0.1 10*3/uL (ref 0.0–0.5)
Eosinophils Relative: 2 %
HCT: 26.1 % — ABNORMAL LOW (ref 36.0–46.0)
Hemoglobin: 8.8 g/dL — ABNORMAL LOW (ref 12.0–15.0)
Immature Granulocytes: 1 %
Lymphocytes Relative: 9 %
Lymphs Abs: 0.6 10*3/uL — ABNORMAL LOW (ref 0.7–4.0)
MCH: 31.2 pg (ref 26.0–34.0)
MCHC: 33.7 g/dL (ref 30.0–36.0)
MCV: 92.6 fL (ref 80.0–100.0)
Monocytes Absolute: 0.7 10*3/uL (ref 0.1–1.0)
Monocytes Relative: 10 %
Neutro Abs: 5.2 10*3/uL (ref 1.7–7.7)
Neutrophils Relative %: 77 %
Platelets: 239 10*3/uL (ref 150–400)
RBC: 2.82 MIL/uL — ABNORMAL LOW (ref 3.87–5.11)
RDW: 12.8 % (ref 11.5–15.5)
WBC: 6.6 10*3/uL (ref 4.0–10.5)
nRBC: 0 % (ref 0.0–0.2)

## 2020-05-23 LAB — BASIC METABOLIC PANEL
Anion gap: 7 (ref 5–15)
BUN: 5 mg/dL — ABNORMAL LOW (ref 8–23)
CO2: 29 mmol/L (ref 22–32)
Calcium: 8.4 mg/dL — ABNORMAL LOW (ref 8.9–10.3)
Chloride: 92 mmol/L — ABNORMAL LOW (ref 98–111)
Creatinine, Ser: 0.56 mg/dL (ref 0.44–1.00)
GFR calc Af Amer: >60 mL/min (ref >60)
GFR calc non Af Amer: >60 mL/min (ref >60)
Glucose, Bld: 113 mg/dL — ABNORMAL HIGH (ref 70–99)
Potassium: 4.1 mmol/L (ref 3.5–5.1)
Sodium: 128 mmol/L — ABNORMAL LOW (ref 135–145)

## 2020-05-23 MED ORDER — VITAMIN D 25 MCG (1000 UNIT) PO TABS
2000.0000 [IU] | ORAL_TABLET | Freq: Two times a day (BID) | ORAL | Status: DC
  Administered 2020-05-23 – 2020-06-05 (×26): 2000 [IU] via ORAL
  Filled 2020-05-23 (×26): qty 2

## 2020-05-23 MED ORDER — ASCORBIC ACID 500 MG PO TABS
500.0000 mg | ORAL_TABLET | Freq: Every day | ORAL | Status: DC
  Administered 2020-05-23 – 2020-06-05 (×14): 500 mg via ORAL
  Filled 2020-05-23 (×14): qty 1

## 2020-05-23 MED ORDER — SODIUM CHLORIDE 0.9 % IV SOLN: INTRAVENOUS | Status: DC

## 2020-05-23 MED ORDER — ACETAMINOPHEN 325 MG PO TABS
650.0000 mg | ORAL_TABLET | Freq: Three times a day (TID) | ORAL | Status: DC
  Administered 2020-05-23 – 2020-06-05 (×36): 650 mg via ORAL
  Filled 2020-05-23 (×38): qty 2

## 2020-05-23 NOTE — Progress Notes (Signed)
PROGRESS NOTE    Katherine Benson   TTS:[id number]  DOB: 1941-07-29  DOA: 05/19/2020 PCP: Heber D[place], MD   Brief Narrative:  Katherine Benson is a 79 y.o. female with PMH of DM2, HTN, Crohn's disease, MGUS who was recently hospitalized in [place] for a L tibial plateau fracture and discharged to rehab for nonoperative management. On 9/7, she had another fall at rehab. EMS attended. Unaware of her extreme sensitivity to opioid, she was given IV fentanyl after which she required a brief period of CPR.   In the ER, patient was lethargic, revived with Narcan. CT scan of the left lower extremity showed comminuted fractures of the proximal tibia involving the lateral > medial tibial plateau; depression of the articular surface and impaction at the fracture line Admitted for further evaluation and management.   Subjective: No complaints.   Assessment & Plan:   Principal Problem:   Tibial plateau fracture, left, closed, with delayed healing, subsequent encounter - related to fall while at rehab - 9/9 - s/p ORIF of left tibial fracture -  Will return to SNF   Active Problems:   Unresponsive episode/ accidental opioid overdose  - treated with CPR and Narcan - suspected to be due to Fentanyl - holding off on IV narcotics - troponin peaked at 71 and then trended down - ECHO showed an EF 50-55%- see below -she has been  stable since      Acute hyponatremia - she was given NS infusion and sodium improved to 130 but has dropped again - will resume NS today and recheck sodium in AM - I have advised her to improve oral intake of solids and liquids    MGUS (monoclonal gammopathy of unknown significance) - cont surveillance as outpt    Crohn disease ([place]) - follows with GI as outpatient- not on medications    Diabetes mellitus type 2 in obese (HCC) - A1c 6.1 on 05/20/20- cont diet control    Time spent in minutes: 35 DVT prophylaxis: enoxaparin (LOVENOX) injection 40 mg Start:  05/22/20 0900 Foot Pump / plexipulse Start: 05/19/20 0817 SCDs Start: 05/19/20 0815   Code Status: Full code Family Communication:  Disposition Plan:  Status is: Inpatient  Remains inpatient appropriate because:treating hyponatermia with IVF - Can be discharged once sodium improves   Dispo: The patient is from: SNF              Anticipated d/c is to: SNF              Anticipated d/c date is: 1 day              Patient currently is not medically stable to d/c.      Consultants:   none Procedures:    Open reduction internal fixation of left tibial plateau fracture Antimicrobials:  Anti-infectives (From admission, onward)   Start     Dose/Rate Route Frequency Ordered Stop   05/21/20 1630  clindamycin (CLEOCIN) IVPB 900 mg       Note to Pharmacy: Please give 8 hours from intraoperative dose   900 mg 100 mL/hr over 30 Minutes Intravenous Every 8 hours 05/21/20 1247 05/22/20 0921   05/21/20 1003  vancomycin (VANCOCIN) powder  Status:  Discontinued          As needed 05/21/20 1003 05/21/20 1031       Objective: Vitals:   05/22/20 1200 05/22/20 1504 05/22/20 2059 05/23/20 0435  BP:  (!) 147/81 (!) 154/72 (!) 177/93  Pulse:  87  88 90  Resp:  18 20 20   Temp:  99.2 F (37.3 C) 98.2 F (36.8 C) 98.2 F (36.8 C)  TempSrc:  Oral Oral Oral  SpO2: 94% 94% 93% 92%  Weight:      Height:        Intake/Output Summary (Last 24 hours) at 05/23/2020 1402 Last data filed at 05/23/2020 1029 Gross per 24 hour  Intake 300 ml  Output 2225 ml  Net -1925 ml   Filed Weights   05/21/20 1215  Weight: 70.7 kg    Examination: General exam: Appears comfortable  HEENT: PERRLA, oral mucosa moist, no sclera icterus or thrush Respiratory system: Clear to auscultation. Respiratory effort normal. Cardiovascular system: S1 & S2 heard, RRR.   Gastrointestinal system: Abdomen soft, non-tender, nondistended. Normal bowel sounds. Central nervous system: Alert and oriented. No focal  neurological deficits. Extremities: No cyanosis, clubbing or edema Skin: No rashes or ulcers Psychiatry:  Mood & affect appropriate.     Data Reviewed: I have personally reviewed following labs and imaging studies  CBC: Recent Labs  Lab 05/19/20 0504 05/20/20 0448 05/21/20 0447 05/22/20 0424 05/23/20 0411  WBC 16.2* 7.6 6.0 6.4 6.6  NEUTROABS 15.0*  --  4.6 5.1 5.2  HGB 10.9* 9.8* 8.4* 8.2* 8.8*  HCT 32.1* 29.3* 26.0* 24.9* 26.1*  MCV 90.9 91.6 93.2 91.2 92.6  PLT 313 248 200 202 937   Basic Metabolic Panel: Recent Labs  Lab 05/19/20 0504 05/20/20 0448 05/21/20 0447 05/22/20 0424 05/23/20 0411  NA 126* 126* 130* 128* 128*  K 3.4* 3.6 3.4* 3.6 4.1  CL 89* 92* 96* 93* 92*  CO2 27 26 27 27 29   GLUCOSE 147* 110* 95 101* 113*  BUN 8 <5* <5* <5* 5*  CREATININE 0.68 0.49 0.67 0.58 0.56  CALCIUM 8.7* 8.5* 7.9* 7.9* 8.4*   GFR: Estimated Creatinine Clearance: 51.7 mL/min (by C-G formula based on SCr of 0.56 mg/dL). Liver Function Tests: No results for input(s): AST, ALT, ALKPHOS, BILITOT, PROT, ALBUMIN in the last 168 hours. No results for input(s): LIPASE, AMYLASE in the last 168 hours. No results for input(s): AMMONIA in the last 168 hours. Coagulation Profile: No results for input(s): INR, PROTIME in the last 168 hours. Cardiac Enzymes: Recent Labs  Lab 05/19/20 0504  CKTOTAL 76   BNP (last 3 results) No results for input(s): PROBNP in the last 8760 hours. HbA1C: Recent Labs    05/20/20 1829  HGBA1C 6.1*   CBG: Recent Labs  Lab 05/21/20 1035  GLUCAP 119*   Lipid Profile: No results for input(s): CHOL, HDL, LDLCALC, TRIG, CHOLHDL, LDLDIRECT in the last 72 hours. Thyroid Function Tests: No results for input(s): TSH, T4TOTAL, FREET4, T3FREE, THYROIDAB in the last 72 hours. Anemia Panel: No results for input(s): VITAMINB12, FOLATE, FERRITIN, TIBC, IRON, RETICCTPCT in the last 72 hours. Urine analysis:    Component Value Date/Time   BILIRUBINUR  negative 09/15/2019 1347   KETONESUR negative 09/15/2019 1347   PROTEINUR negative 09/15/2019 1347   UROBILINOGEN 0.2 09/15/2019 1347   NITRITE Negative 09/15/2019 1347   LEUKOCYTESUR Negative 09/15/2019 1347   Sepsis Labs: @LABRCNTIP (procalcitonin:4,lacticidven:4) ) Recent Results (from the past 240 hour(s))  SARS Coronavirus 2 by RT PCR (hospital order, performed in [facility] hospital lab) Nasopharyngeal Nasopharyngeal Swab     Status: None   Collection Time: 05/19/20  6:50 AM   Specimen: Nasopharyngeal Swab  Result Value Ref Range Status   SARS Coronavirus 2 NEGATIVE NEGATIVE Final    Comment: (  NOTE) SARS-CoV-2 target nucleic acids are NOT DETECTED.  The SARS-CoV-2 RNA is generally detectable in upper and lower respiratory specimens during the acute phase of infection. The lowest concentration of SARS-CoV-2 viral copies this assay can detect is 250 copies / mL. A negative result does not preclude SARS-CoV-2 infection and should not be used as the sole basis for treatment or other patient management decisions.  A negative result may occur with improper specimen collection / handling, submission of specimen other than nasopharyngeal swab, presence of viral mutation(s) within the areas targeted by this assay, and inadequate number of viral copies (<250 copies / mL). A negative result must be combined with clinical observations, patient history, and epidemiological information.  Fact Sheet for Patients:   StrictlyIdeas.no  Fact Sheet for Healthcare Providers: BankingDealers.co.za  This test is not yet approved or  cleared by the [place] FDA and has been authorized for detection and/or diagnosis of SARS-CoV-2 by FDA under an Emergency Use Authorization (EUA).  This EUA will remain in effect (meaning this test can be used) for the duration of the COVID-19 declaration under Section 564(b)(1) of the Act, 21 U.S.C. section  360bbb-3(b)(1), unless the authorization is terminated or revoked sooner.  Performed at [facility], [place] [address], [place], [place] [address]   Surgical pcr screen     Status: None   Collection Time: 05/20/20  3:11 PM   Specimen: Nasal Mucosa; Nasal Swab  Result Value Ref Range Status   MRSA, PCR NEGATIVE NEGATIVE Final   Staphylococcus aureus NEGATIVE NEGATIVE Final    Comment: (NOTE) The Xpert SA Assay (FDA approved for NASAL specimens in patients 47 years of age and older), is one component of a comprehensive surveillance program. It is not intended to diagnose infection nor to guide or monitor treatment. Performed at [facility], [place] [address], [place], [place] [address]          Radiology Studies: No results found.    Scheduled Meds: . aspirin  325 mg Oral Daily  . carbamazepine  200 mg Oral BID  . dicyclomine  10 mg Oral BID AC & HS  . diltiazem  180 mg Oral QHS  . docusate sodium  100 mg Oral BID  . enoxaparin (LOVENOX) injection  40 mg Subcutaneous Q24H  . escitalopram  10 mg Oral Daily  . loratadine  10 mg Oral Daily  . multivitamin  1 tablet Oral Daily  . pantoprazole  40 mg Oral Daily  . polyvinyl alcohol  1 drop Both Eyes BID  . senna-docusate  1 tablet Oral QHS  . triamcinolone  1 spray Nasal BID   Continuous Infusions: . sodium chloride 100 mL/hr at 05/23/20 1014  . methocarbamol (ROBAXIN) IV       LOS: 3 days      Debbe Odea, MD [facility] Pager: [email or web address] 05/23/2020, 2:02 PM

## 2020-05-23 NOTE — Progress Notes (Deleted)
Orthopedic Tech Progress Note Patient Details:  Katherine Benson [birth year] [id number]  Application of Abdominal Binder per nurse request   Post Interventions Patient Tolerated: Well Instructions Provided: Adjustment of device, Care of device, Poper ambulation with device   Katherine Benson P Lorel Monaco 05/23/2020, 3:07 PM

## 2020-05-23 NOTE — Progress Notes (Signed)
Orthopedic Tech Progress Note Patient Details:  Katherine Benson [birth year] [id number]  Ortho Devices Type of Ortho Device: Prafo boot/shoe Ortho Device/Splint Location: Left Lower Extremity Ortho Device/Splint Interventions: Ordered, Application   Post Interventions Patient Tolerated: Well Instructions Provided: Adjustment of device, Care of device, Poper ambulation with device   Katherine Benson P Lorel Monaco 05/23/2020, 3:12 PM

## 2020-05-23 NOTE — Progress Notes (Signed)
Orthopaedic Trauma Service Progress Note  Patient ID: Katherine Benson MRN: [id number] DOB/AGE: Dec 08, 1940 79 y.o.  Subjective:  Doing ok   Talking on cell phone but there does not appear to be anyone on the other end of the line   Reports moderate pain in L knee   Unclear if pt worked with therapy today   CBGs look good + vitamin d insufficiency   ROS As above  Objective:   VITALS:   Vitals:   05/22/20 1504 05/22/20 2059 05/23/20 0435 05/23/20 1555  BP: (!) 147/81 (!) 154/72 (!) 177/93 (!) 181/95  Pulse: 87 88 90 87  Resp: 18 20 20 20   Temp: 99.2 F (37.3 C) 98.2 F (36.8 C) 98.2 F (36.8 C) (!) 97.5 F (36.4 C)  TempSrc: Oral Oral Oral Oral  SpO2: 94% 93% 92% 97%  Weight:      Height:        Estimated body mass index is 30.44 kg/m as calculated from the following:   Height as of this encounter: 5' (1.524 m).   Weight as of this encounter: 70.7 kg.   Intake/Output      09/10 0701 - 09/11 0700 09/11 0701 - 09/12 0700   P.O. 180 120   I.V. (mL/kg)     IV Piggyback     Total Intake(mL/kg) 180 (2.5) 120 (1.7)   Urine (mL/kg/hr) 2225 (1.3)    Stool 0    Blood     Total Output 2225    Net -2045 +120        Stool Occurrence 1 x      LABS  Results for orders placed or performed during the hospital encounter of 05/19/20 (from the past 24 hour(s))  Basic metabolic panel     Status: Abnormal   Collection Time: 05/23/20  4:11 AM  Result Value Ref Range   Sodium 128 (L) 135 - 145 mmol/L   Potassium 4.1 3.5 - 5.1 mmol/L   Chloride 92 (L) 98 - 111 mmol/L   CO2 29 22 - 32 mmol/L   Glucose, Bld 113 (H) 70 - 99 mg/dL   BUN 5 (L) 8 - 23 mg/dL   Creatinine, Ser 0.56 0.44 - 1.00 mg/dL   Calcium 8.4 (L) 8.9 - 10.3 mg/dL   GFR calc non Af Amer >60 >60 mL/min   GFR calc Af Amer >60 >60 mL/min   Anion gap 7 5 - 15  CBC with Differential/Platelet     Status: Abnormal   Collection Time:  05/23/20  4:11 AM  Result Value Ref Range   WBC 6.6 4.0 - 10.5 K/uL   RBC 2.82 (L) 3.87 - 5.11 MIL/uL   Hemoglobin 8.8 (L) 12.0 - 15.0 g/dL   HCT 26.1 (L) 36 - 46 %   MCV 92.6 80.0 - 100.0 fL   MCH 31.2 26.0 - 34.0 pg   MCHC 33.7 30.0 - 36.0 g/dL   RDW 12.8 11.5 - 15.5 %   Platelets 239 150 - 400 K/uL   nRBC 0.0 0.0 - 0.2 %   Neutrophils Relative % 77 %   Neutro Abs 5.2 1.7 - 7.7 K/uL   Lymphocytes Relative 9 %   Lymphs Abs 0.6 (L) 0.7 - 4.0 K/uL   Monocytes Relative 10 %   Monocytes Absolute 0.7 0 -  1 K/uL   Eosinophils Relative 2 %   Eosinophils Absolute 0.1 0 - 0 K/uL   Basophils Relative 1 %   Basophils Absolute 0.0 0 - 0 K/uL   Immature Granulocytes 1 %   Abs Immature Granulocytes 0.04 0.00 - 0.07 K/uL     PHYSICAL EXAM:   Gen: NAD, appears well Lungs: unlabored  Cardiac: regular  Ext:       Left Lower Extremity   Hinged brace in place and is locked in full extension as per order  PRAFO fitting well   Dressings removed   Surgical wounds look great    Scant bloody drainage   Mild swelling L leg   Ext warm   + DP pulse  No DCT   Compartments are soft   No pain out of proportion with passive stretching   DPN, SPN, TN sensation intact  EHL, FHL, lesser toe motor intact  Ankle flexion, extension, inversion and eversion intact   Assessment/Plan: 2 Days Post-Op   Principal Problem:   Fall at home, initial encounter Active Problems:   Unresponsive episode   Hypertension   MGUS (monoclonal gammopathy of unknown significance)   Crohn disease ([place])   Diabetes mellitus type 2 in obese ([place])   Tibial plateau fracture, left, closed, with delayed healing, subsequent encounter   Acute hyponatremia   Anti-infectives (From admission, onward)   Start     Dose/Rate Route Frequency Ordered Stop   05/21/20 1630  clindamycin (CLEOCIN) IVPB 900 mg       Note to Pharmacy: Please give 8 hours from intraoperative dose   900 mg 100 mL/hr over 30 Minutes Intravenous Every  8 hours 05/21/20 1247 05/22/20 0921   05/21/20 1003  vancomycin (VANCOCIN) powder  Status:  Discontinued          As needed 05/21/20 1003 05/21/20 1031    .  POD/HD#: 2  79 y/o female s/p fall with L bicondylar tibial plateau fracture   -fall   - L bicondylar tibial plateau fracture s/p ORIF   NWB L leg  No knee ROM x 2 weeks   Hinged brace locked in extension   Dressing changed today    Change again on Monday     Adaptic, 4x4s ace.  If no drainage ok to leave wounds open to air    Subtle drainage persists   PT/OT  Ice and elevate    Continue with PRAFO to help maintain neutral ankle position    Check skin on back of ankle regularly for pressure sore   - Pain management:  Multimodal   Minimize narcotics   - ABL anemia/Hemodynamics  Stable   Cbc in am   - Medical issues   Per primary   - DVT/PE prophylaxis:  Lovenox    SCD R leg   - ID:   periop abx   - Metabolic Bone Disease:  Vitamin d insufficiency    Supplement  - Activity:  As above  - FEN/GI prophylaxis/Foley/Lines:  Reg diet   - Impediments to fracture healing:  Vitamin d insufficiency   Low energy fx   MGUS    Pt currently on prolia   - Dispo:  Ortho issues addressed  Ok to dc to SNF from ortho standpoint     Jari Pigg, PA-C [phone] (C) 05/23/2020, 5:08 PM  [facility] [place] [phone] Domingo Sep (F)

## 2020-05-23 NOTE — Progress Notes (Addendum)
OT Note  OT order for foot plate at this time. Due to patients hinge brace, it is recommended a prafo placement would help with ankle dorsiflexion to 90 and proper fit of the hinge brace. OT meeting with ortho tech to ensure proper fit and decrease risk for pressure wound. Recommend loose strapping for the top of the prafo to keep pressure off the calf area. OT to continue to monitor   Patient can wear brace 4 hours on Brace off 4 hours to allow for rest breaks If patient tolerates sleeping in brace it is allowed. Encourage ankle flexion during skin checks   Brynn, OTR/L  Acute Rehabilitation Services Pager: [phone] Office: [phone]  Time 15 minutes  .

## 2020-05-24 LAB — CBC
HCT: 26.1 % — ABNORMAL LOW (ref 36.0–46.0)
Hemoglobin: 8.4 g/dL — ABNORMAL LOW (ref 12.0–15.0)
MCH: 29.2 pg (ref 26.0–34.0)
MCHC: 32.2 g/dL (ref 30.0–36.0)
MCV: 90.6 fL (ref 80.0–100.0)
Platelets: 242 10*3/uL (ref 150–400)
RBC: 2.88 MIL/uL — ABNORMAL LOW (ref 3.87–5.11)
RDW: 12.9 % (ref 11.5–15.5)
WBC: 5.4 10*3/uL (ref 4.0–10.5)
nRBC: 0 % (ref 0.0–0.2)

## 2020-05-24 LAB — BASIC METABOLIC PANEL
Anion gap: 7 (ref 5–15)
BUN: <5 mg/dL — ABNORMAL LOW (ref 8–23)
CO2: 29 mmol/L (ref 22–32)
Calcium: 8.5 mg/dL — ABNORMAL LOW (ref 8.9–10.3)
Chloride: 94 mmol/L — ABNORMAL LOW (ref 98–111)
Creatinine, Ser: 0.49 mg/dL (ref 0.44–1.00)
GFR calc Af Amer: >60 mL/min (ref >60)
GFR calc non Af Amer: >60 mL/min (ref >60)
Glucose, Bld: 104 mg/dL — ABNORMAL HIGH (ref 70–99)
Potassium: 3.5 mmol/L (ref 3.5–5.1)
Sodium: 130 mmol/L — ABNORMAL LOW (ref 135–145)

## 2020-05-24 LAB — SARS CORONAVIRUS 2 BY RT PCR (HOSPITAL ORDER, PERFORMED IN ~~LOC~~ HOSPITAL LAB): SARS Coronavirus 2: NEGATIVE

## 2020-05-24 MED ORDER — LORAZEPAM 2 MG/ML IJ SOLN
0.5000 mg | Freq: Four times a day (QID) | INTRAMUSCULAR | Status: DC | PRN
  Administered 2020-05-24: 0.5 mg via INTRAVENOUS
  Filled 2020-05-24: qty 1

## 2020-05-24 MED ORDER — LOSARTAN POTASSIUM 50 MG PO TABS
100.0000 mg | ORAL_TABLET | Freq: Every day | ORAL | Status: DC
  Administered 2020-05-24 – 2020-06-05 (×13): 100 mg via ORAL
  Filled 2020-05-24 (×13): qty 2

## 2020-05-24 NOTE — Progress Notes (Addendum)
PROGRESS NOTE    Katherine Benson   QMG:[id number]  DOB: [birth year]  DOA: 05/19/2020 PCP: Heber Sumatra, MD   Brief Narrative:  Katherine Benson is a 79 y.o. female with PMH of DM2, HTN, Crohn's disease, MGUS who was recently hospitalized in [place] for a L tibial plateau fracture and discharged to rehab for nonoperative management. On 9/7, she had another fall at rehab. EMS attended. Unaware of her extreme sensitivity to opioid, she was given IV fentanyl after which she required a brief period of CPR.   In the ER, patient was lethargic, revived with Narcan. CT scan of the left lower extremity showed comminuted fractures of the proximal tibia involving the lateral > medial tibial plateau; depression of the articular surface and impaction at the fracture line Admitted for further evaluation and management.   Subjective: She has no complaints  Assessment & Plan:   Principal Problem:   Tibial plateau fracture, left, closed, with delayed healing, subsequent encounter - related to fall while at rehab - 9/9 - s/p ORIF of left tibial fracture -  Will return to SNF   Active Problems:   Unresponsive episode/ accidental opioid overdose  - treated with CPR and Narcan - suspected to be due to Fentanyl - holding off on IV narcotics - troponin peaked at 71 and then trended down - ECHO showed an EF 50-55%- see below -she has been  stable since      Acute hyponatremia - 9/11> she was given NS infusion and sodium improved to 130 but has dropped again to 128 - will resume NS today and recheck sodium in AM - I have advised her to improve oral intake of solids and liquids - 9/12> noted that IVF were not running- RN states night RN turned them off as BP was elevated- I have asked for the fluids to be resumed - Na+ 130 today- cont IVF and recheck tomorrow I would like Na+ level to be close to 135 prior to discharge  Confusion - based on my interactions with her I suspect she may have underlying  dementia that has not yet been diagnosed - she needs cognitive testing as outpateint    MGUS (monoclonal gammopathy of unknown significance) - cont surveillance as outpt    Crohn disease ([place]) - follows with GI as outpatient- not on medications    Diabetes mellitus type 2 in obese (HCC) - A1c 6.1 on 05/20/20- cont diet control    Time spent in minutes: 35 DVT prophylaxis: enoxaparin (LOVENOX) injection 40 mg Start: 05/22/20 0900 Foot Pump / plexipulse Start: 05/19/20 0817 SCDs Start: 05/19/20 0815   Code Status: Full code Family Communication:  Disposition Plan:  Status is: Inpatient  Remains inpatient appropriate because:treating hyponatermia with IVF - Can be discharged once sodium improves    Dispo: The patient is from: SNF              Anticipated d/c is to: SNF              Anticipated d/c date is: 1 day              Patient currently is not medically stable to d/c.      Consultants:   none Procedures:    Open reduction internal fixation of left tibial plateau fracture Antimicrobials:  Anti-infectives (From admission, onward)   Start     Dose/Rate Route Frequency Ordered Stop   05/21/20 1630  clindamycin (CLEOCIN) IVPB 900 mg       Note  to Pharmacy: Please give 8 hours from intraoperative dose   900 mg 100 mL/hr over 30 Minutes Intravenous Every 8 hours 05/21/20 1247 05/22/20 0921   05/21/20 1003  vancomycin (VANCOCIN) powder  Status:  Discontinued          As needed 05/21/20 1003 05/21/20 1031       Objective: Vitals:   05/23/20 1555 05/23/20 2010 05/23/20 2203 05/24/20 0543  BP: (!) 181/95 (!) 168/83 138/68 (!) 171/86  Pulse: 87 83 82 72  Resp: 20 17  15   Temp: (!) 97.5 F (36.4 C) 98 F (36.7 C)  97.7 F (36.5 C)  TempSrc: Oral Oral  Oral  SpO2: 97% 98%  99%  Weight:      Height:        Intake/Output Summary (Last 24 hours) at 05/24/2020 1203 Last data filed at 05/24/2020 0543 Gross per 24 hour  Intake 1013.33 ml  Output 1800 ml  Net  -786.67 ml   Filed Weights   05/21/20 1215  Weight: 70.7 kg    Examination: General exam: Appears comfortable  HEENT: PERRLA, oral mucosa moist, no sclera icterus or thrush Respiratory system: Clear to auscultation. Respiratory effort normal. Cardiovascular system: S1 & S2 heard,  No murmurs  Gastrointestinal system: Abdomen soft, non-tender, nondistended. Normal bowel sounds   Central nervous system: Alert and oriented. No focal neurological deficits. Extremities: No cyanosis, clubbing or edema Skin: No rashes or ulcers Psychiatry:  Mood & affect appropriate.     Data Reviewed: I have personally reviewed following labs and imaging studies  CBC: Recent Labs  Lab 05/19/20 0504 05/19/20 0504 05/20/20 0448 05/21/20 0447 05/22/20 0424 05/23/20 0411 05/24/20 0150  WBC 16.2*   < > 7.6 6.0 6.4 6.6 5.4  NEUTROABS 15.0*  --   --  4.6 5.1 5.2  --   HGB 10.9*   < > 9.8* 8.4* 8.2* 8.8* 8.4*  HCT 32.1*   < > 29.3* 26.0* 24.9* 26.1* 26.1*  MCV 90.9   < > 91.6 93.2 91.2 92.6 90.6  PLT 313   < > 248 200 202 239 242   < > = values in this interval not displayed.   Basic Metabolic Panel: Recent Labs  Lab 05/20/20 0448 05/21/20 0447 05/22/20 0424 05/23/20 0411 05/24/20 0150  NA 126* 130* 128* 128* 130*  K 3.6 3.4* 3.6 4.1 3.5  CL 92* 96* 93* 92* 94*  CO2 26 27 27 29 29   GLUCOSE 110* 95 101* 113* 104*  BUN <5* <5* <5* 5* <5*  CREATININE 0.49 0.67 0.58 0.56 0.49  CALCIUM 8.5* 7.9* 7.9* 8.4* 8.5*   GFR: Estimated Creatinine Clearance: 51.7 mL/min (by C-G formula based on SCr of 0.49 mg/dL). Liver Function Tests: No results for input(s): AST, ALT, ALKPHOS, BILITOT, PROT, ALBUMIN in the last 168 hours. No results for input(s): LIPASE, AMYLASE in the last 168 hours. No results for input(s): AMMONIA in the last 168 hours. Coagulation Profile: No results for input(s): INR, PROTIME in the last 168 hours. Cardiac Enzymes: Recent Labs  Lab 05/19/20 0504  CKTOTAL 76   BNP  (last 3 results) No results for input(s): PROBNP in the last 8760 hours. HbA1C: No results for input(s): HGBA1C in the last 72 hours. CBG: Recent Labs  Lab 05/21/20 1035  GLUCAP 119*   Lipid Profile: No results for input(s): CHOL, HDL, LDLCALC, TRIG, CHOLHDL, LDLDIRECT in the last 72 hours. Thyroid Function Tests: No results for input(s): TSH, T4TOTAL, FREET4, T3FREE, THYROIDAB in the  last 72 hours. Anemia Panel: No results for input(s): VITAMINB12, FOLATE, FERRITIN, TIBC, IRON, RETICCTPCT in the last 72 hours. Urine analysis:    Component Value Date/Time   BILIRUBINUR negative 09/15/2019 1347   KETONESUR negative 09/15/2019 1347   PROTEINUR negative 09/15/2019 1347   UROBILINOGEN 0.2 09/15/2019 1347   NITRITE Negative 09/15/2019 1347   LEUKOCYTESUR Negative 09/15/2019 1347   Sepsis Labs: @LABRCNTIP (procalcitonin:4,lacticidven:4) ) Recent Results (from the past 240 hour(s))  SARS Coronavirus 2 by RT PCR (hospital order, performed in [facility] hospital lab) Nasopharyngeal Nasopharyngeal Swab     Status: None   Collection Time: 05/19/20  6:50 AM   Specimen: Nasopharyngeal Swab  Result Value Ref Range Status   SARS Coronavirus 2 NEGATIVE NEGATIVE Final    Comment: (NOTE) SARS-CoV-2 target nucleic acids are NOT DETECTED.  The SARS-CoV-2 RNA is generally detectable in upper and lower respiratory specimens during the acute phase of infection. The lowest concentration of SARS-CoV-2 viral copies this assay can detect is 250 copies / mL. A negative result does not preclude SARS-CoV-2 infection and should not be used as the sole basis for treatment or other patient management decisions.  A negative result may occur with improper specimen collection / handling, submission of specimen other than nasopharyngeal swab, presence of viral mutation(s) within the areas targeted by this assay, and inadequate number of viral copies (<250 copies / mL). A negative result must be combined  with clinical observations, patient history, and epidemiological information.  Fact Sheet for Patients:   StrictlyIdeas.no  Fact Sheet for Healthcare Providers: BankingDealers.co.za  This test is not yet approved or  cleared by the [place] FDA and has been authorized for detection and/or diagnosis of SARS-CoV-2 by FDA under an Emergency Use Authorization (EUA).  This EUA will remain in effect (meaning this test can be used) for the duration of the COVID-19 declaration under Section 564(b)(1) of the Act, 21 U.S.C. section 360bbb-3(b)(1), unless the authorization is terminated or revoked sooner.  Performed at [facility], [place] [address], [place], [place] [address]   Surgical pcr screen     Status: None   Collection Time: 05/20/20  3:11 PM   Specimen: Nasal Mucosa; Nasal Swab  Result Value Ref Range Status   MRSA, PCR NEGATIVE NEGATIVE Final   Staphylococcus aureus NEGATIVE NEGATIVE Final    Comment: (NOTE) The Xpert SA Assay (FDA approved for NASAL specimens in patients 54 years of age and older), is one component of a comprehensive surveillance program. It is not intended to diagnose infection nor to guide or monitor treatment. Performed at [facility], [place] [address], [place], [place] [address]   SARS Coronavirus 2 by RT PCR (hospital order, performed in [facility] hospital lab) Nasopharyngeal Nasopharyngeal Swab     Status: None   Collection Time: 05/24/20  7:29 AM   Specimen: Nasopharyngeal Swab  Result Value Ref Range Status   SARS Coronavirus 2 NEGATIVE NEGATIVE Final    Comment: (NOTE) SARS-CoV-2 target nucleic acids are NOT DETECTED.  The SARS-CoV-2 RNA is generally detectable in upper and lower respiratory specimens during the acute phase of infection. The lowest concentration of SARS-CoV-2 viral copies this assay can detect is 250 copies / mL. A negative result does not preclude SARS-CoV-2  infection and should not be used as the sole basis for treatment or other patient management decisions.  A negative result may occur with improper specimen collection / handling, submission of specimen other than nasopharyngeal swab, presence  of viral mutation(s) within the areas targeted by this assay, and inadequate number of viral copies (<250 copies / mL). A negative result must be combined with clinical observations, patient history, and epidemiological information.  Fact Sheet for Patients:   StrictlyIdeas.no  Fact Sheet for Healthcare Providers: BankingDealers.co.za  This test is not yet approved or  cleared by the [place] FDA and has been authorized for detection and/or diagnosis of SARS-CoV-2 by FDA under an Emergency Use Authorization (EUA).  This EUA will remain in effect (meaning this test can be used) for the duration of the COVID-19 declaration under Section 564(b)(1) of the Act, 21 U.S.C. section 360bbb-3(b)(1), unless the authorization is terminated or revoked sooner.  Performed at [facility], [place] [address] [place], [place] [address]          Radiology Studies: No results found.    Scheduled Meds: . acetaminophen  650 mg Oral Q8H  . vitamin C  500 mg Oral Daily  . aspirin  325 mg Oral Daily  . carbamazepine  200 mg Oral BID  . cholecalciferol  2,000 Units Oral BID  . dicyclomine  10 mg Oral BID AC & HS  . diltiazem  180 mg Oral QHS  . docusate sodium  100 mg Oral BID  . enoxaparin (LOVENOX) injection  40 mg Subcutaneous Q24H  . escitalopram  10 mg Oral Daily  . loratadine  10 mg Oral Daily  . losartan  100 mg Oral Daily  . multivitamin  1 tablet Oral Daily  . pantoprazole  40 mg Oral Daily  . polyvinyl alcohol  1 drop Both Eyes BID  . senna-docusate  1 tablet Oral QHS  . triamcinolone  1 spray Nasal BID   Continuous Infusions: . sodium chloride 100 mL/hr at 05/23/20 1014  .  methocarbamol (ROBAXIN) IV       LOS: 4 days      Debbe Odea, MD [facility] Pager: [email or web address] 05/24/2020, 12:03 PM

## 2020-05-24 NOTE — Progress Notes (Signed)
Pt's BP was 171/86. PRN hydralazine given. Will recheck BP.

## 2020-05-25 LAB — BASIC METABOLIC PANEL
Anion gap: 10 (ref 5–15)
BUN: 5 mg/dL — ABNORMAL LOW (ref 8–23)
CO2: 26 mmol/L (ref 22–32)
Calcium: 8.3 mg/dL — ABNORMAL LOW (ref 8.9–10.3)
Chloride: 92 mmol/L — ABNORMAL LOW (ref 98–111)
Creatinine, Ser: 0.45 mg/dL (ref 0.44–1.00)
GFR calc Af Amer: >60 mL/min (ref >60)
GFR calc non Af Amer: >60 mL/min (ref >60)
Glucose, Bld: 113 mg/dL — ABNORMAL HIGH (ref 70–99)
Potassium: 3.5 mmol/L (ref 3.5–5.1)
Sodium: 128 mmol/L — ABNORMAL LOW (ref 135–145)

## 2020-05-25 LAB — URINALYSIS, ROUTINE W REFLEX MICROSCOPIC
Bilirubin Urine: NEGATIVE
Glucose, UA: NEGATIVE mg/dL
Hgb urine dipstick: NEGATIVE
Ketones, ur: 5 mg/dL — AB
Leukocytes,Ua: NEGATIVE
Nitrite: NEGATIVE
Protein, ur: NEGATIVE mg/dL
Specific Gravity, Urine: 1.008 (ref 1.005–1.030)
pH: 8 (ref 5.0–8.0)

## 2020-05-25 MED ORDER — SODIUM CHLORIDE 1 G PO TABS
1.0000 g | ORAL_TABLET | Freq: Three times a day (TID) | ORAL | Status: AC
  Administered 2020-05-25 – 2020-05-26 (×4): 1 g via ORAL
  Filled 2020-05-25 (×4): qty 1

## 2020-05-25 MED ORDER — SENNOSIDES-DOCUSATE SODIUM 8.6-50 MG PO TABS
1.0000 | ORAL_TABLET | Freq: Two times a day (BID) | ORAL | Status: DC | PRN
  Administered 2020-05-26 – 2020-05-28 (×3): 1 via ORAL
  Filled 2020-05-25 (×3): qty 1

## 2020-05-25 MED ORDER — LORAZEPAM 0.5 MG PO TABS
0.5000 mg | ORAL_TABLET | Freq: Four times a day (QID) | ORAL | Status: DC | PRN
  Administered 2020-05-25 – 2020-06-04 (×11): 0.5 mg via ORAL
  Filled 2020-05-25 (×12): qty 1

## 2020-05-25 NOTE — Progress Notes (Signed)
NA is 128, Dr. Dwyane Dee informed and 1.5 L/24 hour fluid restriction added.

## 2020-05-25 NOTE — Progress Notes (Signed)
PT Cancellation Note  Patient Details Name: Katherine Benson MRN: [id number] DOB: 12-24-1940   Cancelled Treatment:    Reason Eval/Treat Not Completed: Patient declined, no reason specified.  Pt declines and determined she is not feeling like moving much.  No specific details were given.  Agreed to do therapy tomorrow.   Ramond Dial 05/25/2020, 6:00 PM   Mee Hives, PT MS Acute Rehab Dept. Number: [place] and [place]

## 2020-05-25 NOTE — Progress Notes (Addendum)
Orthopaedic Trauma Progress Note  S: Doing okay this morning. Pain in leg manageable. Main complaint is she is having trouble having a BM. She is unsure when she last had one. Dulcolax and Miralax available for patient as needed  O:  Vitals:   05/24/20 2001 05/25/20 0407  BP: (!) 172/90 (!) 165/89  Pulse: 83 85  Resp: 18 17  Temp: 98 F (36.7 C) 98.2 F (36.8 C)  SpO2: 98% 98%    General: Laying in bed, NAD Respiratory:  No increased work of breathing.  Extremity: Hinge brace in place, locked in extension. Dressing removed, incisions C/D/I. Ankle dorsiflexion/plantarflexion intact. Wiggles toes. Endorses sensation to light touch distally. + DP pulse  Imaging: Stable post op imaging.   Labs:  No results found for this or any previous visit (from the past 24 hour(s)).  Assessment: 79 year old female s/p fall, 4 Days Post-Op   Injuries:  Left bicondylar tibial plateau fracture s/p ORIF  Weightbearing: NWB LLE  Insicional and dressing care:  Change dressing PRN  Showering:  Okay to shower with assistance   Orthopedic device(s): Hinge knee brace LLE.  PRAFO boot as needed. Hinge brace should remain locked in full extension  CV/Blood loss: Hgb 8.4, stable   Pain management:  1. Tylenol 325-650 mg q 6 hours scheduled 2. Robaxin 500 mg q 6 hours PRN 3. Norco 5-325 mg q 6 hours PRN 4. Morphine 0.5 mg q 2 hours PRN  VTE prophylaxis: Lovenox, SCDs  ID: Clindamycin post op completed  Foley/Lines:  No foley, KVO IVFs  Medical co-morbidities: MGUS; Crohn's; DM; and HTN   Impediments to Fracture Healing: Vit D level 24, continue D3 supplementation  Dispo: Therapies as tolerated.  Ok to return to SNF from ortho standpoint once cleared by therapies and medicine team  Follow - up plan: 2 weeks  Contact information:  Katha Hamming MD, Patrecia Pace PA-C   Ashutosh Dieguez [facility] [phone] (office) [email or web address]

## 2020-05-25 NOTE — Progress Notes (Signed)
Updated son, Konrad Dolores, on the plan of care via phone.

## 2020-05-25 NOTE — Progress Notes (Signed)
Orthopedic Tech Progress Note Patient Details:  Katherine Benson 44/46/1900 [id number] Called [employer] to have someone come out and adjustment  Patient ID: Katherine Benson, female   DOB: 06/23/1941, 79 y.o.   MRN: [id number]   [name] 05/25/2020, 4:30 PM

## 2020-05-25 NOTE — Progress Notes (Signed)
Occupational Therapy Treatment Patient Details Name: Katherine Benson MRN: [id number] DOB: Dec 31, 1940 Today's Date: 05/25/2020    History of present illness Patient is a 79 y/o female who presents after fall at rehab, + CPR after given opiod and s/p narcan. CT-comminuted fx of left proximal tibia, lateral>medial tibial plateau now s/p ORIF 9/9. PMH includes recently hospitalization in [place] for a Lft tibial plateau fracture and discharged to rehab, MGUS, HTN, DM.   OT comments  OT checking patient L LE due to prafo placed 05/23/20 for foot drop. Pt able to complete ankle flexion today to 90 degrees. All skin integrity intact without any issues. OT contacting ortho tech regarding hinge brace fit. [employer] rep needs to address hinge brace for proper fit after bandage take down. RN made aware as well. OT to continue to follow acutely. Prafo can be removed for 4 hours and wear time of 4 hours. If patient is sleeping in the brace then she can keep it on if tolerating. Encourage ankle pumps bil at this time.   Follow Up Recommendations  SNF    Equipment Recommendations  None recommended by OT    Recommendations for Other Services      Precautions / Restrictions Precautions Precautions: Fall;Other (comment) Precaution Comments: watch O2 Required Braces or Orthoses: Other Brace Other Brace: Bledsoe brace LLE locked into knee extension/ prafo Restrictions LLE Weight Bearing: Non weight bearing       Mobility Bed Mobility Overal bed mobility: Needs Assistance Bed Mobility: Rolling Rolling: Total assist         General bed mobility comments: rollign R and L with pillow for L LE support. pt reports SOB but when checked O2 [place] 3L 94%. pt with anxiety.  Transfers                      Balance                                           ADL either performed or assessed with clinical judgement   ADL   Eating/Feeding: Set up;Bed level                            Toileting- Clothing Manipulation and Hygiene: Total assistance         General ADL Comments: pt supine with extended abdomen and use of purewick. pt rolling this session to allow RN staff to address skin needs and adjust linen     Vision       Perception     Praxis      Cognition Arousal/Alertness: Awake/alert Behavior During Therapy: Anxious Overall Cognitive Status: Impaired/Different from baseline Area of Impairment: Memory                               General Comments: calling husband to let him know that she was nauseated instead of call RN or pushing the call bell. Pt lacks awareness to get assistance from staff but instead is calling spouse. RN reports that patient called spouse in middl eof the night         Exercises Exercises: General Lower Extremity General Exercises - Lower Extremity Ankle Circles/Pumps: AROM;Both;15 reps;Supine   Shoulder Instructions       General Comments 3L [place]  Pertinent Vitals/ Pain       Pain Assessment: Faces Faces Pain Scale: Hurts little more Pain Location: LLE Pain Descriptors / Indicators: Grimacing;Guarding Pain Intervention(s): Monitored during session;Repositioned  Home Living                                          Prior Functioning/Environment              Frequency  Min 2X/week        Progress Toward Goals  OT Goals(current goals can now be found in the care plan section)  Progress towards OT goals: Progressing toward goals  Acute Rehab OT Goals Patient Stated Goal: goin to all my husband OT Goal Formulation: With patient Time For Goal Achievement: 06/05/20 Potential to Achieve Goals: Good ADL Goals Pt Will Perform Grooming: with set-up;sitting Pt Will Perform Upper Body Dressing: with min assist;sitting Pt Will Perform Lower Body Dressing: with min assist;sitting/lateral leans;sit to/from stand;with adaptive equipment Pt Will Transfer to Toilet:  with mod assist;squat pivot transfer;bedside commode Additional ADL Goal #1: Pt will complete bed mobility at mod A level in preparation for OOB BADL tasks  Plan Discharge plan remains appropriate    Co-evaluation                 AM-PAC OT "6 Clicks" Daily Activity     Outcome Measure   Help from another person eating meals?: A Little Help from another person taking care of personal grooming?: A Little Help from another person toileting, which includes using toliet, bedpan, or urinal?: Total Help from another person bathing (including washing, rinsing, drying)?: Total Help from another person to put on and taking off regular upper body clothing?: A Lot Help from another person to put on and taking off regular lower body clothing?: Total 6 Click Score: 11    End of Session Equipment Utilized During Treatment: Oxygen  OT Visit Diagnosis: Unsteadiness on feet (R26.81);Other abnormalities of gait and mobility (R26.89);Muscle weakness (generalized) (M62.81);Other symptoms and signs involving cognitive function   Activity Tolerance Patient tolerated treatment well   Patient Left in bed;with call bell/phone within reach;with bed alarm set   Nurse Communication Mobility status        Time: 1497-0263 OT Time Calculation (min): 26 min  Charges: OT General Charges $OT Visit: 1 Visit OT Treatments $Self Care/Home Management : 8-22 mins $Therapeutic Activity: 8-22 mins   Brynn, OTR/L  Acute Rehabilitation Services Pager: [phone] Office: [phone] .    Jeri Modena 05/25/2020, 4:14 PM

## 2020-05-25 NOTE — Progress Notes (Signed)
Ordered a Prevalon boot for the right lower extremity to protect the heel and prevent foot drop.

## 2020-05-25 NOTE — TOC Progression Note (Addendum)
Transition of Care [facility]) - Progression Note    Patient Details  Name: Danitza Schoenfeldt MRN: [id number] Date of Birth: [birth year]  Transition of Care [facility]) CM/SW [place], [place] Phone Number: 05/25/2020, 11:28 AM  Clinical Narrative:    4:08pm- CSW received confirmation of auth from [facility] for pt return when clinically ready.  11:28am- CSW spoke with pt son Konrad Dolores via telephone this morning [phone]. Pt son confirms plan for pt to dc back to [facility] when she is ready. He is aware that we are still monitoring labs, he requested a clinical update from MD or RN when they can. CSW provided my number for any additional discharge planning questions.   CSW messaged Dr. Dwyane Dee and Sharee Pimple, RN, to make them aware and to alert need for COVID swab when pt ready.    Expected Discharge Plan: [place] Barriers to Discharge: Continued Medical Work up  Expected Discharge Plan and Services Expected Discharge Plan: [place] In-house Referral: Clinical Social Work Discharge Planning Services: CM Consult Post Acute Care Choice: [place], Resumption of Svcs/PTA Provider Living arrangements for the past 2 months: [place], [place]  Readmission Risk Interventions Readmission Risk Prevention Plan 05/25/2020  Transportation Screening Complete  PCP or Specialist Appt within 5-7 Days Not Complete  Not Complete comments plan for return to SNF  Home Care Screening Complete  Medication Review (RN CM) Referral to Pharmacy  Some recent data might be hidden

## 2020-05-25 NOTE — Progress Notes (Addendum)
PROGRESS NOTE    Katherine Benson  STM:[id number] DOB: 1941/08/04 DOA: 05/19/2020 PCP: Heber Sombrillo, MD    Brief Narrative: Katherine Benson is a 79 y.o.femalewith PMH of DM2, HTN, Crohn's disease, MGUS who was recently hospitalized in [place] for a L tibial plateau fracture and discharged to rehab for nonoperative management. On 9/7, she had another fall at rehab. EMS attended. Unaware of her extreme sensitivity to opioid, she was given IV fentanyl after which she required a brief period of CPR.  In the ER, patient was lethargic, revived with Narcan. CT scan of the left lower extremity showed comminuted fractures of the proximal tibia involving the lateral > medial tibial plateau; depression of the articular surface and impaction at the fracture line. Admitted for further evaluation and management. She has developed hyponatremia, slowly improving.    Assessment & Plan:   Principal Problem:   Fall at home, initial encounter Active Problems:   Unresponsive episode   Hypertension   MGUS (monoclonal gammopathy of unknown significance)   Crohn disease ([place])   Diabetes mellitus type 2 in obese ([place])   Tibial plateau fracture, left, closed, with delayed healing, subsequent encounter   Acute hyponatremia   Tibial plateau fracture, left, closed, with delayed healing, subsequent encounter - related to fall while at rehab. - 9/9 - s/p ORIF of left tibial fracture. -  Awaiting  return to SNF      Unresponsive episode/ accidental opioid overdose  - treated with CPR and Narcan - suspected to be due to Fentanyl - holding off on IV narcotics. - troponin peaked at 71 and then trended down. - ECHO showed an EF 50-55%- see below - She has been  stable since      Acute hyponatremia - 9/11> she was given NS infusion and sodium improved to 130 but has dropped again to 128. -  NS resumed 9/12 and recheck sodium 128. -  advised her to improve oral intake of solids and liquids. - 9/12> noted  that IVF were not running- RN states night RN turned them off as BP was elevated-  -  Na+ 128 today- cont IVF and recheck tomorrow ,  Na+ level to be close to 135 prior to discharge.  Confusion  - based on my interactions with her I suspect she may have underlying dementia that has not yet been diagnosed - she needs cognitive testing as outpatient. - she appears alert, oriented x 2.     MGUS (monoclonal gammopathy of unknown significance) - cont surveillance as outpt.    Crohn disease ([place]) - follows with GI as outpatient- not on medications.    Diabetes mellitus type 2 in obese ([facility]) - A1c 6.1 on 05/20/20- cont diet control    DVT prophylaxis: Lovenox Code Status: Full Family Communication: discussed with patient.  Disposition Plan:  Status is: Inpatient  Remains inpatient appropriate because:treating hyponatermia with IVF - Can be discharged once sodium improves    Dispo: The patient is from: SNF  Anticipated d/c is to: SNF  Anticipated d/c date is: 1 day  Patient currently is not medically stable to d/c.    Consultants:   None.  Procedures:  None.  Antimicrobials: Anti-infectives (From admission, onward)   Start     Dose/Rate Route Frequency Ordered Stop   05/21/20 1630  clindamycin (CLEOCIN) IVPB 900 mg       Note to Pharmacy: Please give 8 hours from intraoperative dose   900 mg 100 mL/hr over 30 Minutes Intravenous Every 8  hours 05/21/20 1247 05/22/20 0921   05/21/20 1003  vancomycin (VANCOCIN) powder  Status:  Discontinued          As needed 05/21/20 1003 05/21/20 1031      Subjective: Patient was seen and examined at bedside.  Overnight events noted.  She complains of having a stomachache.   She reports not having a bowel movement in few days.  Asking for something to help bowel movement.  Left leg is in immobilizer.  Objective: Vitals:   05/24/20 1442 05/24/20 2001 05/25/20 0407 05/25/20 1436  BP: (!)  173/91 (!) 172/90 (!) 165/89 (!) 149/75  Pulse: 83 83 85 97  Resp: 20 18 17 16   Temp: 98.2 F (36.8 C) 98 F (36.7 C) 98.2 F (36.8 C) 98.9 F (37.2 C)  TempSrc: Oral Oral Oral Oral  SpO2: 98% 98% 98% (!) 81%  Weight:      Height:        Intake/Output Summary (Last 24 hours) at 05/25/2020 1626 Last data filed at 05/25/2020 1250 Gross per 24 hour  Intake 657 ml  Output 1300 ml  Net -643 ml   Filed Weights   05/21/20 1215  Weight: 70.7 kg    Examination:  General exam: Appears calm and comfortable,  Respiratory system: Clear to auscultation. Respiratory effort normal. Cardiovascular system: S1 & S2 heard, RRR. No JVD, murmurs, rubs, gallops or clicks. No pedal edema. Gastrointestinal system: Abdomen is slightly distended, soft and nontender. No organomegaly or masses felt. Normal bowel sounds heard. Central nervous system: Alert and oriented. No focal neurological deficits. Extremities: Left leg in immobilizer. Stitches noted in leg. Skin: No rashes, lesions or ulcers Psychiatry: Judgement and insight appear normal. Mood & affect appropriate.     Data Reviewed: I have personally reviewed following labs and imaging studies  CBC: Recent Labs  Lab 05/19/20 0504 05/19/20 0504 05/20/20 0448 05/21/20 0447 05/22/20 0424 05/23/20 0411 05/24/20 0150  WBC 16.2*   < > 7.6 6.0 6.4 6.6 5.4  NEUTROABS 15.0*  --   --  4.6 5.1 5.2  --   HGB 10.9*   < > 9.8* 8.4* 8.2* 8.8* 8.4*  HCT 32.1*   < > 29.3* 26.0* 24.9* 26.1* 26.1*  MCV 90.9   < > 91.6 93.2 91.2 92.6 90.6  PLT 313   < > 248 200 202 239 242   < > = values in this interval not displayed.   Basic Metabolic Panel: Recent Labs  Lab 05/21/20 0447 05/22/20 0424 05/23/20 0411 05/24/20 0150 05/25/20 1123  NA 130* 128* 128* 130* 128*  K 3.4* 3.6 4.1 3.5 3.5  CL 96* 93* 92* 94* 92*  CO2 27 27 29 29 26   GLUCOSE 95 101* 113* 104* 113*  BUN <5* <5* 5* <5* 5*  CREATININE 0.67 0.58 0.56 0.49 0.45  CALCIUM 7.9* 7.9* 8.4*  8.5* 8.3*   GFR: Estimated Creatinine Clearance: 51.7 mL/min (by C-G formula based on SCr of 0.45 mg/dL). Liver Function Tests: No results for input(s): AST, ALT, ALKPHOS, BILITOT, PROT, ALBUMIN in the last 168 hours. No results for input(s): LIPASE, AMYLASE in the last 168 hours. No results for input(s): AMMONIA in the last 168 hours. Coagulation Profile: No results for input(s): INR, PROTIME in the last 168 hours. Cardiac Enzymes: Recent Labs  Lab 05/19/20 0504  CKTOTAL 76   BNP (last 3 results) No results for input(s): PROBNP in the last 8760 hours. HbA1C: No results for input(s): HGBA1C in the last 72 hours.  CBG: Recent Labs  Lab 05/21/20 1035  GLUCAP 119*   Lipid Profile: No results for input(s): CHOL, HDL, LDLCALC, TRIG, CHOLHDL, LDLDIRECT in the last 72 hours. Thyroid Function Tests: No results for input(s): TSH, T4TOTAL, FREET4, T3FREE, THYROIDAB in the last 72 hours. Anemia Panel: No results for input(s): VITAMINB12, FOLATE, FERRITIN, TIBC, IRON, RETICCTPCT in the last 72 hours. Sepsis Labs: No results for input(s): PROCALCITON, LATICACIDVEN in the last 168 hours.  Recent Results (from the past 240 hour(s))  SARS Coronavirus 2 by RT PCR (hospital order, performed in [facility] hospital lab) Nasopharyngeal Nasopharyngeal Swab     Status: None   Collection Time: 05/19/20  6:50 AM   Specimen: Nasopharyngeal Swab  Result Value Ref Range Status   SARS Coronavirus 2 NEGATIVE NEGATIVE Final    Comment: (NOTE) SARS-CoV-2 target nucleic acids are NOT DETECTED.  The SARS-CoV-2 RNA is generally detectable in upper and lower respiratory specimens during the acute phase of infection. The lowest concentration of SARS-CoV-2 viral copies this assay can detect is 250 copies / mL. A negative result does not preclude SARS-CoV-2 infection and should not be used as the sole basis for treatment or other patient management decisions.  A negative result may occur with improper  specimen collection / handling, submission of specimen other than nasopharyngeal swab, presence of viral mutation(s) within the areas targeted by this assay, and inadequate number of viral copies (<250 copies / mL). A negative result must be combined with clinical observations, patient history, and epidemiological information.  Fact Sheet for Patients:   StrictlyIdeas.no  Fact Sheet for Healthcare Providers: BankingDealers.co.za  This test is not yet approved or  cleared by the [place] FDA and has been authorized for detection and/or diagnosis of SARS-CoV-2 by FDA under an Emergency Use Authorization (EUA).  This EUA will remain in effect (meaning this test can be used) for the duration of the COVID-19 declaration under Section 564(b)(1) of the Act, 21 U.S.C. section 360bbb-3(b)(1), unless the authorization is terminated or revoked sooner.  Performed at [facility], [place] [address], [place], [place] [address]   Surgical pcr screen     Status: None   Collection Time: 05/20/20  3:11 PM   Specimen: Nasal Mucosa; Nasal Swab  Result Value Ref Range Status   MRSA, PCR NEGATIVE NEGATIVE Final   Staphylococcus aureus NEGATIVE NEGATIVE Final    Comment: (NOTE) The Xpert SA Assay (FDA approved for NASAL specimens in patients 60 years of age and older), is one component of a comprehensive surveillance program. It is not intended to diagnose infection nor to guide or monitor treatment. Performed at [facility], [place] [address], [place], [place] [address]   SARS Coronavirus 2 by RT PCR (hospital order, performed in [facility] hospital lab) Nasopharyngeal Nasopharyngeal Swab     Status: None   Collection Time: 05/24/20  7:29 AM   Specimen: Nasopharyngeal Swab  Result Value Ref Range Status   SARS Coronavirus 2 NEGATIVE NEGATIVE Final    Comment: (NOTE) SARS-CoV-2 target nucleic acids are NOT DETECTED.  The SARS-CoV-2 RNA  is generally detectable in upper and lower respiratory specimens during the acute phase of infection. The lowest concentration of SARS-CoV-2 viral copies this assay can detect is 250 copies / mL. A negative result does not preclude SARS-CoV-2 infection and should not be used as the sole basis for treatment or other patient management decisions.  A negative result may occur with improper specimen collection / handling, submission  of specimen other than nasopharyngeal swab, presence of viral mutation(s) within the areas targeted by this assay, and inadequate number of viral copies (<250 copies / mL). A negative result must be combined with clinical observations, patient history, and epidemiological information.  Fact Sheet for Patients:   StrictlyIdeas.no  Fact Sheet for Healthcare Providers: BankingDealers.co.za  This test is not yet approved or  cleared by the [place] FDA and has been authorized for detection and/or diagnosis of SARS-CoV-2 by FDA under an Emergency Use Authorization (EUA).  This EUA will remain in effect (meaning this test can be used) for the duration of the COVID-19 declaration under Section 564(b)(1) of the Act, 21 U.S.C. section 360bbb-3(b)(1), unless the authorization is terminated or revoked sooner.  Performed at [facility], [place] [address] [place], [place] [address]     Radiology Studies: No results found.   Scheduled Meds: . acetaminophen  650 mg Oral Q8H  . vitamin C  500 mg Oral Daily  . aspirin  325 mg Oral Daily  . carbamazepine  200 mg Oral BID  . cholecalciferol  2,000 Units Oral BID  . dicyclomine  10 mg Oral BID AC & HS  . diltiazem  180 mg Oral QHS  . docusate sodium  100 mg Oral BID  . enoxaparin (LOVENOX) injection  40 mg Subcutaneous Q24H  . escitalopram  10 mg Oral Daily  . loratadine  10 mg Oral Daily  . losartan  100 mg Oral Daily  . multivitamin  1 tablet Oral Daily  .  pantoprazole  40 mg Oral Daily  . polyvinyl alcohol  1 drop Both Eyes BID  . triamcinolone  1 spray Nasal BID   Continuous Infusions: . sodium chloride 100 mL/hr at 05/25/20 1106  . methocarbamol (ROBAXIN) IV       LOS: 5 days    Time spent: 25 mins.    Shawna Clamp, MD [facility]   If 7PM-7AM, please contact night-coverage

## 2020-05-26 ENCOUNTER — Encounter (HOSPITAL_COMMUNITY): Payer: Self-pay | Admitting: Internal Medicine

## 2020-05-26 ENCOUNTER — Inpatient Hospital Stay (HOSPITAL_COMMUNITY): Payer: Medicare HMO

## 2020-05-26 LAB — BASIC METABOLIC PANEL
Anion gap: 7 (ref 5–15)
BUN: 5 mg/dL — ABNORMAL LOW (ref 8–23)
CO2: 28 mmol/L (ref 22–32)
Calcium: 8.4 mg/dL — ABNORMAL LOW (ref 8.9–10.3)
Chloride: 92 mmol/L — ABNORMAL LOW (ref 98–111)
Creatinine, Ser: 0.52 mg/dL (ref 0.44–1.00)
GFR calc Af Amer: >60 mL/min (ref >60)
GFR calc non Af Amer: >60 mL/min (ref >60)
Glucose, Bld: 105 mg/dL — ABNORMAL HIGH (ref 70–99)
Potassium: 3.4 mmol/L — ABNORMAL LOW (ref 3.5–5.1)
Sodium: 127 mmol/L — ABNORMAL LOW (ref 135–145)

## 2020-05-26 LAB — HEMOGLOBIN AND HEMATOCRIT, BLOOD
HCT: 27.4 % — ABNORMAL LOW (ref 36.0–46.0)
Hemoglobin: 8.9 g/dL — ABNORMAL LOW (ref 12.0–15.0)

## 2020-05-26 MED ORDER — POTASSIUM CHLORIDE 20 MEQ PO PACK
40.0000 meq | PACK | Freq: Once | ORAL | Status: AC
  Administered 2020-05-26: 40 meq via ORAL
  Filled 2020-05-26: qty 2

## 2020-05-26 NOTE — Progress Notes (Signed)
PROGRESS NOTE    Katherine Benson  ASN:[id number] DOB: Dec 07, 1940 DOA: 05/19/2020 PCP: [place], MD    Brief Narrative: Katherine Benson is a 79 y.o.femalewith PMH of DM2, HTN, Crohn's disease, MGUS who was recently hospitalized in [place] for a L tibial plateau fracture and discharged to rehab for nonoperative management. On 9/7, she had another fall at rehab. EMS attended. Unaware of her extreme sensitivity to opioid, she was given IV fentanyl after which she required a brief period of CPR.  In the ER, patient was lethargic, revived with Narcan. CT scan of the left lower extremity showed comminuted fractures of the proximal tibia involving the lateral > medial tibial plateau; depression of the articular surface and impaction at the fracture line.  9/9 - s/p ORIF of left tibial fracture. She has developed hyponatremia, slowly improving. She has not had a bowel movement in 3 days, has developed abdominal distention,  ultrasound negative for acute abnormality.Awaiting SNF placement.      Assessment & Plan:   Principal Problem:   Fall at home, initial encounter Active Problems:   Unresponsive episode   Hypertension   MGUS (monoclonal gammopathy of unknown significance)   Crohn disease ([place])   Diabetes mellitus type 2 in obese ([place])   Tibial plateau fracture, left, closed, with delayed healing, subsequent encounter   Acute hyponatremia   Tibial plateau fracture, left, closed, with delayed healing, subsequent encounter - related to fall while at rehab. - 9/9 - s/p ORIF of left tibial fracture. -  Awaiting  return to SNF      Unresponsive episode/ accidental opioid overdose  - treated with CPR and Narcan - suspected to be due to Fentanyl - holding off on IV narcotics. - troponin peaked at 71 and then trended down. - ECHO showed an EF 50-55%- see below - She has been  stable since      Acute hyponatremia - 9/11> she was given NS infusion and sodium improved to 130 but has  dropped again to 128. -  NS resumed 9/12 and recheck sodium 128. -  advised her to improve oral intake of solids and liquids. - 9/12> noted that IVF were not running- RN states night RN turned them off as BP was elevated-  -  Na+ 127 today- cont IVF and recheck tomorrow ,  Na+ level to be close to 135 prior to discharge.  Confusion >>> Improving -  based on my interactions with her I suspect she may have underlying dementia that has not yet been diagnosed - she needs cognitive testing as outpatient. - she appears alert, oriented x 2.     MGUS (monoclonal gammopathy of unknown significance) - cont surveillance as outpt.    Crohn disease ([place]) - follows with GI as outpatient- not on medications.    Diabetes mellitus type 2 in obese ([facility]) - A1c 6.1 on 05/20/20- cont diet control  - Abdominal distention: She has not had a bowel movement in 3 days. Ultrasound abdomen negative for acute abnormality. She is able to pass a lot of gas.  Continue Colace and Senokot She was given tapwater edema.  DVT prophylaxis: Lovenox Code Status: Full Family Communication: discussed with patient.  Disposition Plan:  Status is: Inpatient  Remains inpatient appropriate because:treating hyponatermia with IVF - Can be discharged once sodium improves    Dispo: The patient is from: SNF  Anticipated d/c is to: SNF  Anticipated d/c date is: 1 day  Patient currently is not medically stable to d/c.  Consultants:   None.  Procedures:  None.  Antimicrobials: Anti-infectives (From admission, onward)   Start     Dose/Rate Route Frequency Ordered Stop   05/21/20 1630  clindamycin (CLEOCIN) IVPB 900 mg       Note to Pharmacy: Please give 8 hours from intraoperative dose   900 mg 100 mL/hr over 30 Minutes Intravenous Every 8 hours 05/21/20 1247 05/22/20 0921   05/21/20 1003  vancomycin (VANCOCIN) powder  Status:  Discontinued          As needed 05/21/20  1003 05/21/20 1031      Subjective: Patient was seen and examined at bedside.  Overnight events noted. She is found to have abdominal distention,  bowel sounds are present,  mild tenderness noted.She reports not having a bowel movement in few days.   Objective: Vitals:   05/25/20 2115 05/26/20 0500 05/26/20 1614 05/26/20 1615  BP: (!) 154/85 (!) 147/81 (!) 163/86 (!) 153/92  Pulse: 91 89 88 88  Resp: 18 16 16    Temp: 98.6 F (37 C) 98.4 F (36.9 C) 98 F (36.7 C)   TempSrc: Oral Oral Oral   SpO2: 98% 97% 98%   Weight:      Height:        Intake/Output Summary (Last 24 hours) at 05/26/2020 1639 Last data filed at 05/26/2020 1604 Gross per 24 hour  Intake 1100 ml  Output 4550 ml  Net -3450 ml   Filed Weights   05/21/20 1215  Weight: 70.7 kg    Examination:  General exam: Appears calm and comfortable,  Respiratory system: Clear to auscultation. Respiratory effort normal. Cardiovascular system: S1 & S2 heard, RRR. No JVD, murmurs, rubs, gallops or clicks. No pedal edema. Gastrointestinal system: Abdomen is slightly distended, soft and nontender. No organomegaly or masses felt. Normal bowel sounds heard. Central nervous system: Alert and oriented. No focal neurological deficits. Extremities: Left leg in immobilizer. Stitches noted in leg. Skin: No rashes, lesions or ulcers Psychiatry: Judgement and insight appear normal. Mood & affect appropriate.     Data Reviewed: I have personally reviewed following labs and imaging studies  CBC: Recent Labs  Lab 05/20/20 0448 05/20/20 0448 05/21/20 0447 05/22/20 0424 05/23/20 0411 05/24/20 0150 05/26/20 0506  WBC 7.6  --  6.0 6.4 6.6 5.4  --   NEUTROABS  --   --  4.6 5.1 5.2  --   --   HGB 9.8*   < > 8.4* 8.2* 8.8* 8.4* 8.9*  HCT 29.3*   < > 26.0* 24.9* 26.1* 26.1* 27.4*  MCV 91.6  --  93.2 91.2 92.6 90.6  --   PLT 248  --  200 202 239 242  --    < > = values in this interval not displayed.   Basic Metabolic  Panel: Recent Labs  Lab 05/22/20 0424 05/23/20 0411 05/24/20 0150 05/25/20 1123 05/26/20 0506  NA 128* 128* 130* 128* 127*  K 3.6 4.1 3.5 3.5 3.4*  CL 93* 92* 94* 92* 92*  CO2 27 29 29 26 28   GLUCOSE 101* 113* 104* 113* 105*  BUN <5* 5* <5* 5* 5*  CREATININE 0.58 0.56 0.49 0.45 0.52  CALCIUM 7.9* 8.4* 8.5* 8.3* 8.4*   GFR: Estimated Creatinine Clearance: 51.7 mL/min (by C-G formula based on SCr of 0.52 mg/dL). Liver Function Tests: No results for input(s): AST, ALT, ALKPHOS, BILITOT, PROT, ALBUMIN in the last 168 hours. No results for input(s): LIPASE, AMYLASE in the last 168 hours. No results for  input(s): AMMONIA in the last 168 hours. Coagulation Profile: No results for input(s): INR, PROTIME in the last 168 hours. Cardiac Enzymes: No results for input(s): CKTOTAL, CKMB, CKMBINDEX, TROPONINI in the last 168 hours. BNP (last 3 results) No results for input(s): PROBNP in the last 8760 hours. HbA1C: No results for input(s): HGBA1C in the last 72 hours. CBG: Recent Labs  Lab 05/21/20 1035  GLUCAP 119*   Lipid Profile: No results for input(s): CHOL, HDL, LDLCALC, TRIG, CHOLHDL, LDLDIRECT in the last 72 hours. Thyroid Function Tests: No results for input(s): TSH, T4TOTAL, FREET4, T3FREE, THYROIDAB in the last 72 hours. Anemia Panel: No results for input(s): VITAMINB12, FOLATE, FERRITIN, TIBC, IRON, RETICCTPCT in the last 72 hours. Sepsis Labs: No results for input(s): PROCALCITON, LATICACIDVEN in the last 168 hours.  Recent Results (from the past 240 hour(s))  SARS Coronavirus 2 by RT PCR (hospital order, performed in [facility] hospital lab) Nasopharyngeal Nasopharyngeal Swab     Status: None   Collection Time: 05/19/20  6:50 AM   Specimen: Nasopharyngeal Swab  Result Value Ref Range Status   SARS Coronavirus 2 NEGATIVE NEGATIVE Final    Comment: (NOTE) SARS-CoV-2 target nucleic acids are NOT DETECTED.  The SARS-CoV-2 RNA is generally detectable in upper and  lower respiratory specimens during the acute phase of infection. The lowest concentration of SARS-CoV-2 viral copies this assay can detect is 250 copies / mL. A negative result does not preclude SARS-CoV-2 infection and should not be used as the sole basis for treatment or other patient management decisions.  A negative result may occur with improper specimen collection / handling, submission of specimen other than nasopharyngeal swab, presence of viral mutation(s) within the areas targeted by this assay, and inadequate number of viral copies (<250 copies / mL). A negative result must be combined with clinical observations, patient history, and epidemiological information.  Fact Sheet for Patients:   StrictlyIdeas.no  Fact Sheet for Healthcare Providers: BankingDealers.co.za  This test is not yet approved or  cleared by the [place] FDA and has been authorized for detection and/or diagnosis of SARS-CoV-2 by FDA under an Emergency Use Authorization (EUA).  This EUA will remain in effect (meaning this test can be used) for the duration of the COVID-19 declaration under Section 564(b)(1) of the Act, 21 U.S.C. section 360bbb-3(b)(1), unless the authorization is terminated or revoked sooner.  Performed at [facility], [place] [address], [place], [place] [address]   Surgical pcr screen     Status: None   Collection Time: 05/20/20  3:11 PM   Specimen: Nasal Mucosa; Nasal Swab  Result Value Ref Range Status   MRSA, PCR NEGATIVE NEGATIVE Final   Staphylococcus aureus NEGATIVE NEGATIVE Final    Comment: (NOTE) The Xpert SA Assay (FDA approved for NASAL specimens in patients 30 years of age and older), is one component of a comprehensive surveillance program. It is not intended to diagnose infection nor to guide or monitor treatment. Performed at [facility], [place] [address] [place], [place] [address]   SARS Coronavirus 2  by RT PCR (hospital order, performed in [facility] hospital lab) Nasopharyngeal Nasopharyngeal Swab     Status: None   Collection Time: 05/24/20  7:29 AM   Specimen: Nasopharyngeal Swab  Result Value Ref Range Status   SARS Coronavirus 2 NEGATIVE NEGATIVE Final    Comment: (NOTE) SARS-CoV-2 target nucleic acids are NOT DETECTED.  The SARS-CoV-2 RNA is generally detectable in upper and lower respiratory  specimens during the acute phase of infection. The lowest concentration of SARS-CoV-2 viral copies this assay can detect is 250 copies / mL. A negative result does not preclude SARS-CoV-2 infection and should not be used as the sole basis for treatment or other patient management decisions.  A negative result may occur with improper specimen collection / handling, submission of specimen other than nasopharyngeal swab, presence of viral mutation(s) within the areas targeted by this assay, and inadequate number of viral copies (<250 copies / mL). A negative result must be combined with clinical observations, patient history, and epidemiological information.  Fact Sheet for Patients:   StrictlyIdeas.no  Fact Sheet for Healthcare Providers: BankingDealers.co.za  This test is not yet approved or  cleared by the [place] FDA and has been authorized for detection and/or diagnosis of SARS-CoV-2 by FDA under an Emergency Use Authorization (EUA).  This EUA will remain in effect (meaning this test can be used) for the duration of the COVID-19 declaration under Section 564(b)(1) of the Act, 21 U.S.C. section 360bbb-3(b)(1), unless the authorization is terminated or revoked sooner.  Performed at [facility], [place] [address] [place], [place] [address]     Radiology Studies: US Abdomen Complete  Result Date: 05/26/2020 CLINICAL DATA:  Abdominal pain EXAM: ABDOMEN ULTRASOUND COMPLETE COMPARISON:  None. FINDINGS: Gallbladder: Sludge is  noted within the gallbladder. There are no evident gallstones, gallbladder wall thickening, or pericholecystic fluid. No sonographic Murphy sign noted by sonographer. Common bile duct: Diameter: A small portion of the common bile duct measures 2 mm. Most of common bile duct is obscured by gas. No intrahepatic biliary duct dilatation is evident. Liver: No focal lesion identified. Liver echogenicity overall is increased. Portal vein is patent on color Doppler imaging with normal direction of blood flow towards the liver. IVC: Inferior vena cava is virtually completely obscured by gas. Pancreas: Visualized portion unremarkable. Much of pancreas obscured by gas. Spleen: Spleen visualization limited due to body habitus. Visualized portion of spleen unremarkable. Right Kidney: Length: 9.2 cm. Echogenicity within normal limits. No mass or hydronephrosis visualized. Left Kidney: Length: 9.3 cm. Echogenicity within normal limits. No mass or hydronephrosis visualized. Abdominal aorta: No aneurysm visualized in visualized regions. Note that portions of the aorta not visualized. Other findings: No evident ascites. IMPRESSION: 1. Somewhat limited study due to bowel gas and patient inability to suspend respiration. 2. Sludge within gallbladder. No gallstones, gallbladder wall thickening, or pericholecystic fluid appreciable. 3. Increased liver echogenicity, a finding felt to be indicative of hepatic steatosis. No focal liver lesions evident. Note that the sensitivity of ultrasound for detection of focal liver lesions is diminished in this circumstance. 4. Much of pancreas obscured by gas. Spleen not well visualized due to body habitus and difficulty suspending respiration. Most of inferior vena cava obscured by gas. Portions of aorta obscured by gas. Electronically Signed   By: Lowella Grip III M.D.   On: 05/26/2020 16:23     Scheduled Meds: . acetaminophen  650 mg Oral Q8H  . vitamin C  500 mg Oral Daily  . aspirin   325 mg Oral Daily  . carbamazepine  200 mg Oral BID  . cholecalciferol  2,000 Units Oral BID  . dicyclomine  10 mg Oral BID AC & HS  . diltiazem  180 mg Oral QHS  . docusate sodium  100 mg Oral BID  . enoxaparin (LOVENOX) injection  40 mg Subcutaneous Q24H  . escitalopram  10 mg Oral Daily  . loratadine  10 mg Oral Daily  . losartan  100 mg Oral Daily  . multivitamin  1 tablet Oral Daily  . pantoprazole  40 mg Oral Daily  . polyvinyl alcohol  1 drop Both Eyes BID  . sodium chloride  1 g Oral TID WC  . triamcinolone  1 spray Nasal BID   Continuous Infusions: . methocarbamol (ROBAXIN) IV       LOS: 6 days    Time spent: 25 mins.    Shawna Clamp, MD [facility]   If 7PM-7AM, please contact night-coverage

## 2020-05-26 NOTE — TOC Progression Note (Signed)
Transition of Care [facility]) - Progression Note    Patient Details  Name: Katherine Benson MRN: [id number] Date of Birth: October 28, 1940  Transition of Care [facility]) CM/SW [place], [place] Phone Number: 05/26/2020, 11:15 AM  Clinical Narrative:    TOC team continuing to follow, pt has bed at [facility] pending medical stability for ongoing SNF care.    Expected Discharge Plan: [place] Barriers to Discharge: Continued Medical Work up  Expected Discharge Plan and Services Expected Discharge Plan: [place] In-house Referral: Clinical Social Work Discharge Planning Services: CM Consult Post Acute Care Choice: [place], Resumption of Svcs/PTA Provider Living arrangements for the past 2 months: [place], [place]  Readmission Risk Interventions Readmission Risk Prevention Plan 05/25/2020  Transportation Screening Complete  PCP or Specialist Appt within 5-7 Days Not Complete  Not Complete comments plan for return to SNF  Home Care Screening Complete  Medication Review (RN CM) Referral to Pharmacy  Some recent data might be hidden

## 2020-05-27 DIAGNOSIS — I10 Essential (primary) hypertension: Secondary | ICD-10-CM

## 2020-05-27 DIAGNOSIS — K50919 Crohn's disease, unspecified, with unspecified complications: Secondary | ICD-10-CM

## 2020-05-27 LAB — BASIC METABOLIC PANEL
Anion gap: 8 (ref 5–15)
BUN: 5 mg/dL — ABNORMAL LOW (ref 8–23)
CO2: 28 mmol/L (ref 22–32)
Calcium: 8.7 mg/dL — ABNORMAL LOW (ref 8.9–10.3)
Chloride: 91 mmol/L — ABNORMAL LOW (ref 98–111)
Creatinine, Ser: 0.52 mg/dL (ref 0.44–1.00)
GFR calc Af Amer: >60 mL/min (ref >60)
GFR calc non Af Amer: >60 mL/min (ref >60)
Glucose, Bld: 98 mg/dL (ref 70–99)
Potassium: 3.4 mmol/L — ABNORMAL LOW (ref 3.5–5.1)
Sodium: 127 mmol/L — ABNORMAL LOW (ref 135–145)

## 2020-05-27 LAB — CBC
HCT: 30.8 % — ABNORMAL LOW (ref 36.0–46.0)
Hemoglobin: 10 g/dL — ABNORMAL LOW (ref 12.0–15.0)
MCH: 29.6 pg (ref 26.0–34.0)
MCHC: 32.5 g/dL (ref 30.0–36.0)
MCV: 91.1 fL (ref 80.0–100.0)
Platelets: 325 10*3/uL (ref 150–400)
RBC: 3.38 MIL/uL — ABNORMAL LOW (ref 3.87–5.11)
RDW: 13.4 % (ref 11.5–15.5)
WBC: 5.8 10*3/uL (ref 4.0–10.5)
nRBC: 0 % (ref 0.0–0.2)

## 2020-05-27 LAB — SODIUM: Sodium: 121 mmol/L — ABNORMAL LOW (ref 135–145)

## 2020-05-27 LAB — PHOSPHORUS: Phosphorus: 3.5 mg/dL (ref 2.5–4.6)

## 2020-05-27 LAB — OSMOLALITY, URINE: Osmolality, Ur: 346 mOsm/kg (ref 300–900)

## 2020-05-27 LAB — MAGNESIUM: Magnesium: 1.8 mg/dL (ref 1.7–2.4)

## 2020-05-27 LAB — SODIUM, URINE, RANDOM: Sodium, Ur: 109 mmol/L

## 2020-05-27 MED ORDER — COSYNTROPIN 0.25 MG IJ SOLR
0.2500 mg | Freq: Once | INTRAMUSCULAR | Status: AC
  Administered 2020-05-28: 0.25 mg via INTRAVENOUS
  Filled 2020-05-27 (×2): qty 0.25

## 2020-05-27 MED ORDER — POLYETHYLENE GLYCOL 3350 17 G PO PACK
17.0000 g | PACK | Freq: Every day | ORAL | Status: DC
  Administered 2020-05-27: 17 g via ORAL
  Filled 2020-05-27: qty 1

## 2020-05-27 MED ORDER — POLYETHYLENE GLYCOL 3350 17 G PO PACK
17.0000 g | PACK | Freq: Two times a day (BID) | ORAL | Status: DC
  Administered 2020-05-27 – 2020-06-05 (×10): 17 g via ORAL
  Filled 2020-05-27 (×12): qty 1

## 2020-05-27 NOTE — Progress Notes (Signed)
Patient continues to have on and off confusion, encouraged and fed for her meals as she refuses to eat, has been passing gas, still waiting for a good bowel movement.

## 2020-05-27 NOTE — TOC Progression Note (Signed)
Transition of Care [facility]) - Progression Note    Patient Details  Name: Katherine Benson MRN: [id number] Date of Birth: March 26, 1941  Transition of Care [facility]) CM/SW [place], [place] Phone Number: 05/27/2020, 11:20 AM  Clinical Narrative:    CSW sent message to [facility] admissions who has been following pt for return when medically stable. Pt will need a new COVID swab prior to discharge.   Expected Discharge Plan: [place] Barriers to Discharge: Continued Medical Work up  Expected Discharge Plan and Services Expected Discharge Plan: [place] In-house Referral: Clinical Social Work Discharge Planning Services: CM Consult Post Acute Care Choice: [place], Resumption of Svcs/PTA Provider Living arrangements for the past 2 months: [place], [place]  Readmission Risk Interventions Readmission Risk Prevention Plan 05/25/2020  Transportation Screening Complete  PCP or Specialist Appt within 5-7 Days Not Complete  Not Complete comments plan for return to SNF  Home Care Screening Complete  Medication Review (RN CM) Referral to Pharmacy  Some recent data might be hidden

## 2020-05-27 NOTE — Progress Notes (Signed)
PT Cancellation Note  Patient Details Name: Katherine Benson MRN: [id number] DOB: 1940-11-22   Cancelled Treatment:    Reason Eval/Treat Not Completed: Other (comment) Pt adamantly refusing to participate in therapy session due to nausea/abdominal discomfort despite max encouragement.    Wyona Almas, PT, DPT Acute Rehabilitation Services Pager [phone] Office [phone]    Deno Etienne 05/27/2020, 1:57 PM

## 2020-05-27 NOTE — Progress Notes (Signed)
PROGRESS NOTE    Katherine Benson  QQV:[id number] DOB: [birth year] DOA: 05/19/2020 PCP: Heber [place], MD   Brief Narrative: Katherine Benson is a 79 y.o. female with a history of MGUS, Crohn's disease, diabetes mellitus, hypertension.  Patient presented secondary to fall was found to have comminuted tibial plateau fracture in setting of previous tibial fracture.  Orthopedic surgery was consulted and patient underwent ORIF on 9/9.  Hospitalization complicated by hyponatremia.   Assessment & Plan:   Principal Problem:   Fall at home, initial encounter Active Problems:   Unresponsive episode   Hypertension   MGUS (monoclonal gammopathy of unknown significance)   Crohn disease ([place])   Diabetes mellitus type 2 in obese ([place])   Tibial plateau fracture, left, closed, with delayed healing, subsequent encounter   Acute hyponatremia   Tibial plateau fracture Patient with previously known fracture that was managed non-operatively. On current admission, evidence of comminuted fracture. Orthopedic surgery consulted and patient underwent ORIF on 9/9. -Orthopedic surgery recommendations: NWB of LLE, hinge knee brace of LLE (locked in full extension), PRAFO boot as needed  Hyponatremia Unknown etiology. Lowest sodium of 126 with initial improvement on NS IV fluids. Urine sodium/osmolality obtained today suggests possible adrenal insufficiency. No associated hypotension noted but rather hypertension. Patient started on isotonic saline -Continue IV fluids -ACTH stimulation test -ACTH lab  Unresponsiveness Accidental opioid overdose Occurred after Fentanyl during EMS transport. Patient underwent chest compressions and Narcan with resolution.  Confusion Improved. Concern there may be underlying dementia. Likely exacerbated by hyponatremia.  Essential hypertension -Continue Diltiazem, losartan  MGUS Surveillance currently. Follows with hematology/oncology  Crohn disease Follows with GI as an  outpatient. Currently not on medication.  Diabetes mellitus, type 2 Hemoglobin A1C of 6.1%. not on medication as an outpatient  Abdominal distension Likely secondary to constipation. Abdominal ultrasound obtained without etiology. -Continue Colace   DVT prophylaxis: Lovenox Code Status:   Code Status: Full Code Family Communication: Son on telephone Disposition Plan: Discharge to SNF likely in 2-3 days pending improvement of hyponatremia   Consultants:   Orthopedic surgery  Procedures:   LEFT ORIF (05/21/2020)  Antimicrobials:  Vancomycin/Clindamycin (Surgical Prophylaxis)    Subjective: No concerns  Objective: Vitals:   05/26/20 1615 05/26/20 2031 05/26/20 2032 05/27/20 0530  BP: (!) 153/92 (!) 177/100 (!) 185/97 (!) 181/96  Pulse: 88 87 87 79  Resp:   18 18  Temp:  98 F (36.7 C)  97.9 F (36.6 C)  TempSrc:  Oral  Oral  SpO2:  99% 99% 98%  Weight:      Height:        Intake/Output Summary (Last 24 hours) at 05/27/2020 0949 Last data filed at 05/27/2020 0530 Gross per 24 hour  Intake 540 ml  Output 2900 ml  Net -2360 ml   Filed Weights   05/21/20 1215  Weight: 70.7 kg    Examination:  General exam: Appears calm and comfortable Respiratory system: Clear to auscultation. Respiratory effort normal. Cardiovascular system: S1 & S2 heard, RRR. 2/6 systolic murmur Gastrointestinal system: Abdomen is distended, soft and mildly tender. No organomegaly or masses felt. Normal bowel sounds heard. Central nervous system: Alert and oriented to person and place. No focal neurological deficits. Musculoskeletal: No edema. No calf tenderness Skin: No cyanosis. No rashes Psychiatry: Judgement and insight appear normal. Mood & affect appropriate.     Data Reviewed: I have personally reviewed following labs and imaging studies  CBC Lab Results  Component Value Date   WBC 5.8  05/27/2020   RBC 3.38 (L) 05/27/2020   HGB 10.0 (L) 05/27/2020   HCT 30.8 (L)  05/27/2020   MCV 91.1 05/27/2020   MCH 29.6 05/27/2020   PLT 325 05/27/2020   MCHC 32.5 05/27/2020   RDW 13.4 05/27/2020   LYMPHSABS 0.6 (L) 05/23/2020   MONOABS 0.7 05/23/2020   EOSABS 0.1 05/23/2020   BASOSABS 0.0 17/00/1749     Last metabolic panel Lab Results  Component Value Date   NA 127 (L) 05/27/2020   K 3.4 (L) 05/27/2020   CL 91 (L) 05/27/2020   CO2 28 05/27/2020   BUN 5 (L) 05/27/2020   CREATININE 0.52 05/27/2020   GLUCOSE 98 05/27/2020   GFRNONAA >60 05/27/2020   GFRAA >60 05/27/2020   CALCIUM 8.7 (L) 05/27/2020   PHOS 3.5 05/27/2020   ANIONGAP 8 05/27/2020    CBG (last 3)  No results for input(s): GLUCAP in the last 72 hours.   GFR: Estimated Creatinine Clearance: 51.7 mL/min (by C-G formula based on SCr of 0.52 mg/dL).  Coagulation Profile: No results for input(s): INR, PROTIME in the last 168 hours.  Recent Results (from the past 240 hour(s))  SARS Coronavirus 2 by RT PCR (hospital order, performed in [facility] hospital lab) Nasopharyngeal Nasopharyngeal Swab     Status: None   Collection Time: 05/19/20  6:50 AM   Specimen: Nasopharyngeal Swab  Result Value Ref Range Status   SARS Coronavirus 2 NEGATIVE NEGATIVE Final    Comment: (NOTE) SARS-CoV-2 target nucleic acids are NOT DETECTED.  The SARS-CoV-2 RNA is generally detectable in upper and lower respiratory specimens during the acute phase of infection. The lowest concentration of SARS-CoV-2 viral copies this assay can detect is 250 copies / mL. A negative result does not preclude SARS-CoV-2 infection and should not be used as the sole basis for treatment or other patient management decisions.  A negative result may occur with improper specimen collection / handling, submission of specimen other than nasopharyngeal swab, presence of viral mutation(s) within the areas targeted by this assay, and inadequate number of viral copies (<250 copies / mL). A negative result must be combined with  clinical observations, patient history, and epidemiological information.  Fact Sheet for Patients:   StrictlyIdeas.no  Fact Sheet for Healthcare Providers: BankingDealers.co.za  This test is not yet approved or  cleared by the [place] FDA and has been authorized for detection and/or diagnosis of SARS-CoV-2 by FDA under an Emergency Use Authorization (EUA).  This EUA will remain in effect (meaning this test can be used) for the duration of the COVID-19 declaration under Section 564(b)(1) of the Act, 21 U.S.C. section 360bbb-3(b)(1), unless the authorization is terminated or revoked sooner.  Performed at [facility], [place] [address] [place], [place] [address]   Surgical pcr screen     Status: None   Collection Time: 05/20/20  3:11 PM   Specimen: Nasal Mucosa; Nasal Swab  Result Value Ref Range Status   MRSA, PCR NEGATIVE NEGATIVE Final   Staphylococcus aureus NEGATIVE NEGATIVE Final    Comment: (NOTE) The Xpert SA Assay (FDA approved for NASAL specimens in patients 36 years of age and older), is one component of a comprehensive surveillance program. It is not intended to diagnose infection nor to guide or monitor treatment. Performed at [facility], [place] [address], [place], [place] [address]   SARS Coronavirus 2 by RT PCR (hospital order, performed in [facility] hospital lab) Nasopharyngeal Nasopharyngeal Swab  Status: None   Collection Time: 05/24/20  7:29 AM   Specimen: Nasopharyngeal Swab  Result Value Ref Range Status   SARS Coronavirus 2 NEGATIVE NEGATIVE Final    Comment: (NOTE) SARS-CoV-2 target nucleic acids are NOT DETECTED.  The SARS-CoV-2 RNA is generally detectable in upper and lower respiratory specimens during the acute phase of infection. The lowest concentration of SARS-CoV-2 viral copies this assay can detect is 250 copies / mL. A negative result does not preclude SARS-CoV-2  infection and should not be used as the sole basis for treatment or other patient management decisions.  A negative result may occur with improper specimen collection / handling, submission of specimen other than nasopharyngeal swab, presence of viral mutation(s) within the areas targeted by this assay, and inadequate number of viral copies (<250 copies / mL). A negative result must be combined with clinical observations, patient history, and epidemiological information.  Fact Sheet for Patients:   StrictlyIdeas.no  Fact Sheet for Healthcare Providers: BankingDealers.co.za  This test is not yet approved or  cleared by the [place] FDA and has been authorized for detection and/or diagnosis of SARS-CoV-2 by FDA under an Emergency Use Authorization (EUA).  This EUA will remain in effect (meaning this test can be used) for the duration of the COVID-19 declaration under Section 564(b)(1) of the Act, 21 U.S.C. section 360bbb-3(b)(1), unless the authorization is terminated or revoked sooner.  Performed at [facility], [place] [address] [place], [place] [address]         Radiology Studies: US Abdomen Complete  Result Date: 05/26/2020 CLINICAL DATA:  Abdominal pain EXAM: ABDOMEN ULTRASOUND COMPLETE COMPARISON:  None. FINDINGS: Gallbladder: Sludge is noted within the gallbladder. There are no evident gallstones, gallbladder wall thickening, or pericholecystic fluid. No sonographic Murphy sign noted by sonographer. Common bile duct: Diameter: A small portion of the common bile duct measures 2 mm. Most of common bile duct is obscured by gas. No intrahepatic biliary duct dilatation is evident. Liver: No focal lesion identified. Liver echogenicity overall is increased. Portal vein is patent on color Doppler imaging with normal direction of blood flow towards the liver. IVC: Inferior vena cava is virtually completely obscured by gas.  Pancreas: Visualized portion unremarkable. Much of pancreas obscured by gas. Spleen: Spleen visualization limited due to body habitus. Visualized portion of spleen unremarkable. Right Kidney: Length: 9.2 cm. Echogenicity within normal limits. No mass or hydronephrosis visualized. Left Kidney: Length: 9.3 cm. Echogenicity within normal limits. No mass or hydronephrosis visualized. Abdominal aorta: No aneurysm visualized in visualized regions. Note that portions of the aorta not visualized. Other findings: No evident ascites. IMPRESSION: 1. Somewhat limited study due to bowel gas and patient inability to suspend respiration. 2. Sludge within gallbladder. No gallstones, gallbladder wall thickening, or pericholecystic fluid appreciable. 3. Increased liver echogenicity, a finding felt to be indicative of hepatic steatosis. No focal liver lesions evident. Note that the sensitivity of ultrasound for detection of focal liver lesions is diminished in this circumstance. 4. Much of pancreas obscured by gas. Spleen not well visualized due to body habitus and difficulty suspending respiration. Most of inferior vena cava obscured by gas. Portions of aorta obscured by gas. Electronically Signed   By: Lowella Grip III M.D.   On: 05/26/2020 16:23        Scheduled Meds: . acetaminophen  650 mg Oral Q8H  . vitamin C  500 mg Oral Daily  . aspirin  325 mg Oral Daily  . carbamazepine  200  mg Oral BID  . cholecalciferol  2,000 Units Oral BID  . dicyclomine  10 mg Oral BID AC & HS  . diltiazem  180 mg Oral QHS  . docusate sodium  100 mg Oral BID  . enoxaparin (LOVENOX) injection  40 mg Subcutaneous Q24H  . escitalopram  10 mg Oral Daily  . loratadine  10 mg Oral Daily  . losartan  100 mg Oral Daily  . multivitamin  1 tablet Oral Daily  . pantoprazole  40 mg Oral Daily  . polyethylene glycol  17 g Oral Daily  . polyvinyl alcohol  1 drop Both Eyes BID  . triamcinolone  1 spray Nasal BID   Continuous  Infusions: . methocarbamol (ROBAXIN) IV       LOS: 7 days     Cordelia Poche, MD [facility] 05/27/2020, 9:49 AM  If 7PM-7AM, please contact night-coverage [email or web address]

## 2020-05-28 ENCOUNTER — Inpatient Hospital Stay (HOSPITAL_COMMUNITY): Payer: Medicare HMO

## 2020-05-28 LAB — BASIC METABOLIC PANEL
Anion gap: 12 (ref 5–15)
BUN: 7 mg/dL — ABNORMAL LOW (ref 8–23)
CO2: 28 mmol/L (ref 22–32)
Calcium: 8.7 mg/dL — ABNORMAL LOW (ref 8.9–10.3)
Chloride: 83 mmol/L — ABNORMAL LOW (ref 98–111)
Creatinine, Ser: 0.54 mg/dL (ref 0.44–1.00)
GFR calc Af Amer: >60 mL/min (ref >60)
GFR calc non Af Amer: >60 mL/min (ref >60)
Glucose, Bld: 100 mg/dL — ABNORMAL HIGH (ref 70–99)
Potassium: 3.5 mmol/L (ref 3.5–5.1)
Sodium: 123 mmol/L — ABNORMAL LOW (ref 135–145)

## 2020-05-28 LAB — OSMOLALITY, URINE: Osmolality, Ur: 446 mOsm/kg (ref 300–900)

## 2020-05-28 LAB — ACTH: C206 ACTH: 4.6 pg/mL — ABNORMAL LOW (ref 7.2–63.3)

## 2020-05-28 LAB — SODIUM
Sodium: 120 mmol/L — ABNORMAL LOW (ref 135–145)
Sodium: 120 mmol/L — ABNORMAL LOW (ref 135–145)
Sodium: 116 mmol/L — CL (ref 135–145)

## 2020-05-28 LAB — SODIUM, URINE, RANDOM: Sodium, Ur: 42 mmol/L

## 2020-05-28 LAB — ACTH STIMULATION, 3 TIME POINTS
Cortisol, 30 Min: 29.9 ug/dL
Cortisol, 60 Min: 35.2 ug/dL
Cortisol, Base: 19.8 ug/dL

## 2020-05-28 MED ORDER — BISACODYL 10 MG RE SUPP
10.0000 mg | Freq: Once | RECTAL | Status: AC
  Administered 2020-05-28: 10 mg via RECTAL
  Filled 2020-05-28: qty 1

## 2020-05-28 MED ORDER — JUVEN PO PACK
1.0000 | PACK | Freq: Two times a day (BID) | ORAL | Status: DC
  Administered 2020-05-29 – 2020-06-05 (×14): 1 via ORAL
  Filled 2020-05-28 (×13): qty 1

## 2020-05-28 NOTE — Consult Note (Signed)
Katherine Benson Admit Date: 05/19/2020 05/28/2020 Rexene Agent Requesting Physician:  Lonny Prude MD  Reason for Consult:  Hyponatremia HPI:  75F admitted on 9/7 after having a fall and cardiac arrest at the nursing home. Before that she had been admitted to an outside facility for a left tibial plateau fracture related to a fall. She recovered quickly with CPR at the nursing facility, thought to be related to fentanyl administration. She was found to have an unstable fracture with her evaluation here and has subsequently undergone ORIF with orthopedics.  Other past history includes MGUS, IBD/Crohn's, DM 2, hypertension. She is on carbamazepine for trigeminal neuralgia started in 2020.  Patient has a normal serum sodium as recently as 04/02/2020 at a value of 136 and in February of this year was 137. Trend in serum sodium while here is as follows:  Sodium (mmol/L)  Date Value  05/28/2020 120 (L)  05/28/2020 123 (L)  05/27/2020 121 (L)  05/27/2020 127 (L)  05/26/2020 127 (L)  05/25/2020 128 (L)  05/24/2020 130 (L)  05/23/2020 128 (L)  05/22/2020 128 (L)  05/21/2020 130 (L)  05/20/2020 126 (L)  05/19/2020 126 (L)  ]   Yesterday her urine osmolality was 346 with a urine sodium of 109, today they were 446 and 42, respectively. She was on a 1.5 L fluid restriction. She had a.m. cortisol testing which was not consistent with adrenal insufficiency. Weights are not reported. Blood pressure is normal/elevated on losartan. She was recently placed on Lexapro at the SNF.  At the current time the patient has nausea, pain. She requires some redirection but is participatory in the conversation. She has not had seizures.   ] I/Os: I/O last 3 completed shifts: In: 360 [P.O.:360] Out: 2050 [Urine:2050]   ROS Balance of 12 systems is negative w/ exceptions as above  PMH  Past Medical History:  Diagnosis Date  . Crohn disease ([place])   . Diabetes mellitus type 2 in obese ([place])   . Hypertension   .  MGUS (monoclonal gammopathy of unknown significance)    PSH  Past Surgical History:  Procedure Laterality Date  . ORIF TIBIA PLATEAU Left 05/21/2020   Procedure: LEFT OPEN REDUCTION INTERNAL FIXATION (ORIF) TIBIAL PLATEAU;  Surgeon: Shona Needles, MD;  Location: [facility];  Service: Orthopedics;  Laterality: Left;   FH History reviewed. No pertinent family history. SH  reports that she has never smoked. She has never used smokeless tobacco. She reports that she does not drink alcohol and does not use drugs. Allergies  Allergies  Allergen Reactions  . Cefaclor Rash  . Mercaptopurine Other (See Comments)    Elevated liver tests Elevated liver tests Elevated liver tests   . Penicillins Rash  . Sulfa Antibiotics Rash  . Erythromycin    Home medications Prior to Admission medications   Medication Sig Start Date End Date Taking? Authorizing Provider  acetaminophen (TYLENOL) 500 MG tablet Take 1,000 mg by mouth every 4 (four) hours as needed for mild pain or fever.   Yes [provider]  aspirin 325 MG EC tablet Take 325 mg by mouth daily. 04/10/20 04/10/21 Yes [provider]  Biotin (SUPER BIOTIN) 5 MG TABS Take 5 mg by mouth daily.    Yes [provider]  calcium citrate (CALCITRATE - DOSED IN MG ELEMENTAL CALCIUM) 950 (200 Ca) MG tablet Take 200 mg of elemental calcium by mouth 2 (two) times daily.   Yes [provider]  carbamazepine (TEGRETOL) 200 MG tablet  Take 200 mg by mouth 2 (two) times daily. 05/08/20  Yes [provider]  cetirizine (ZYRTEC) 10 MG tablet Take 10 mg by mouth daily.   Yes [provider]  Cholecalciferol (VITAMIN D) 50 MCG (2000 UT) tablet Take 2,000 Units by mouth daily.   Yes [provider]  dicyclomine (BENTYL) 10 MG capsule Take 10 mg by mouth 2 (two) times daily at 8 am and 10 pm.  10/11/18  Yes [provider]  diltiazem (TIADYLT ER) 180 MG 24 hr capsule Take 180 mg by mouth at bedtime.   Yes  [provider]  docusate sodium (COLACE) 100 MG capsule Take 100 mg by mouth daily as needed for mild constipation.    Yes [provider]  escitalopram (LEXAPRO) 10 MG tablet Take 10 mg by mouth daily.   Yes [provider]  Multiple Vitamins-Minerals (PRESERVISION AREDS 2) CAPS Take 1 capsule by mouth daily.   Yes [provider]  omeprazole (PRILOSEC) 40 MG capsule Take 40 mg by mouth daily.  11/01/18  Yes [provider]  ondansetron (ZOFRAN-ODT) 4 MG disintegrating tablet Take 4-8 mg by mouth every 8 (eight) hours as needed for nausea or vomiting.   Yes [provider]  Polyethyl Glycol-Propyl Glycol (SYSTANE) 0.4-0.3 % SOLN Place 1 drop into both eyes in the morning and at bedtime.   Yes [provider]  Probiotic Product (PROBIOTIC DAILY PO) Take 1 capsule by mouth daily. 1.5 billion cell cap   Yes [provider]  sennosides-docusate sodium (SENOKOT-S) 8.6-50 MG tablet Take 1 tablet by mouth in the morning and at bedtime.   Yes [provider]  triamcinolone (NASACORT) 55 MCG/ACT AERO nasal inhaler Place 1 spray into the nose 2 (two) times daily.    Yes [provider]  ciprofloxacin (CIPRO) 500 MG tablet Take 1,000 mg by mouth See admin instructions. 1 hr before dental appointment    [provider]  denosumab (PROLIA) 60 MG/ML SOSY injection Inject into the skin.    [provider]  enoxaparin (LOVENOX) 40 MG/0.4ML injection Inject 0.4 mLs (40 mg total) into the skin daily. 05/23/20 06/22/20  Delray Alt, PA-C  HYDROcodone-acetaminophen (NORCO/VICODIN) 5-325 MG tablet Take 1 tablet by mouth every 6 (six) hours as needed for severe pain. 05/22/20   Delray Alt, PA-C    Current Medications Scheduled Meds: . acetaminophen  650 mg Oral Q8H  . vitamin C  500 mg Oral Daily  . aspirin  325 mg Oral Daily  . carbamazepine  200 mg Oral BID  . cholecalciferol  2,000 Units Oral BID   . dicyclomine  10 mg Oral BID AC & HS  . diltiazem  180 mg Oral QHS  . docusate sodium  100 mg Oral BID  . enoxaparin (LOVENOX) injection  40 mg Subcutaneous Q24H  . loratadine  10 mg Oral Daily  . losartan  100 mg Oral Daily  . multivitamin  1 tablet Oral Daily  . pantoprazole  40 mg Oral Daily  . polyethylene glycol  17 g Oral BID  . polyvinyl alcohol  1 drop Both Eyes BID  . triamcinolone  1 spray Nasal BID   Continuous Infusions: . methocarbamol (ROBAXIN) IV     PRN Meds:.bisacodyl, hydrALAZINE, HYDROcodone-acetaminophen, LORazepam, melatonin, methocarbamol **OR** methocarbamol (ROBAXIN) IV, morphine injection, ondansetron (ZOFRAN) IV, senna-docusate  CBC Recent Labs  Lab 05/22/20 0424 05/22/20 0424 05/23/20 0411 05/23/20 0411 05/24/20 0150 05/26/20 0506 05/27/20 0356  WBC 6.4   < >  6.6  --  5.4  --  5.8  NEUTROABS 5.1  --  5.2  --   --   --   --   HGB 8.2*   < > 8.8*   < > 8.4* 8.9* 10.0*  HCT 24.9*   < > 26.1*   < > 26.1* 27.4* 30.8*  MCV 91.2   < > 92.6  --  90.6  --  91.1  PLT 202   < > 239  --  242  --  325   < > = values in this interval not displayed.   Basic Metabolic Panel Recent Labs  Lab 05/22/20 0424 05/22/20 0424 05/23/20 0411 05/23/20 0411 05/24/20 0150 05/25/20 1123 05/26/20 0506 05/27/20 0356 05/27/20 1818 05/28/20 0647 05/28/20 1448  NA 128*   < > 128*   < > 130* 128* 127* 127* 121* 123* 120*  K 3.6  --  4.1  --  3.5 3.5 3.4* 3.4*  --  3.5  --   CL 93*  --  92*  --  94* 92* 92* 91*  --  83*  --   CO2 27  --  29  --  29 26 28 28   --  28  --   GLUCOSE 101*  --  113*  --  104* 113* 105* 98  --  100*  --   BUN <5*  --  5*  --  <5* 5* 5* 5*  --  7*  --   CREATININE 0.58  --  0.56  --  0.49 0.45 0.52 0.52  --  0.54  --   CALCIUM 7.9*  --  8.4*  --  8.5* 8.3* 8.4* 8.7*  --  8.7*  --   PHOS  --   --   --   --   --   --   --  3.5  --   --   --    < > = values in this interval not displayed.    Physical Exam  Blood pressure (!) 151/114, pulse  88, temperature 97.6 F (36.4 C), temperature source Oral, resp. rate 18, height 5' (1.524 m), weight 70.7 kg, SpO2 99 %. GEN: NAD, in bed ENT: NCAT EYES: EOMI CV: Regular, normal S1 and S2 PULM: Clear bilaterally ABD: Soft, nontender SKIN: Left knee incision site is clean/dry EXT: No significant edema NEURO: Alert and oriented, fully participatory in conversation  Assessment 72F with Acute hyponatremia. She is euvolemic. Urine is inappropriately concentrated and urine sodium is elevated consistent with SIADH. She does not have adrenal insufficiency. Likely SIADH is driven by pain, nausea, and Lexapro. She is also taking carbamazepine which has been associated with SIADH.  1. SIADH, moderate: Driven by pain, nausea, SSRI, AED 2. Recent left tibial plateau fracture status post ORIF  Plan 1. Restrict all IV and p.o. fluids to 1 L/day 2. Push protein, start Juven powder 3. Stop SSRI 4. Stop carbamazepine 5. Trend serum sodium every 6 hours 6. If develops progressive disrupted sensorium, seizures, or significant further decline in serum sodium would likely require higher level of care and hypertonic saline 7. We will follow closely 8. Son, Konrad Dolores, who is critical care/ED nurse updated over the phone   Rexene Agent  05/28/2020, 4:00 PM

## 2020-05-28 NOTE — Progress Notes (Signed)
Physical Therapy Treatment Patient Details Name: Katherine Benson MRN: [id number] DOB: December 29, 1940 Today's Date: 05/28/2020    History of Present Illness Patient is a 79 y/o female who presents after fall at rehab, + CPR after given opiod and s/p narcan. CT-comminuted fx of left proximal tibia, lateral>medial tibial plateau now s/p ORIF 9/9. PMH includes recently hospitalization in [place] for a Lft tibial plateau fracture and discharged to rehab, MGUS, HTN, DM.    PT Comments    Pt was seen for bed mobility and exercises to LE's with limitations of LLE in ext at knee.  Pt is max assist to roll, and with dense repetitive cues could help to roll to L side.  Pt is not complaining of pain to move once going, but rather seems anxious in the expectation of pain being elicited.  Follow acutely for transition to chair if pt can be convinced she needs to be OOB.   Follow Up Recommendations  SNF     Equipment Recommendations  None recommended by PT    Recommendations for Other Services       Precautions / Restrictions Precautions Precautions: Fall;Other (comment) Precaution Comments: watch O2 Required Braces or Orthoses: Other Brace Other Brace: Bledsoe brace LLE locked into knee extension/ prafo Restrictions Weight Bearing Restrictions: Yes RLE Weight Bearing: Non weight bearing LLE Weight Bearing: Non weight bearing    Mobility  Bed Mobility Overal bed mobility: Needs Assistance Bed Mobility: Rolling Rolling: Mod assist;Max assist         General bed mobility comments: rolling to get on bedpan with help and cues, pt cannot consistently follow the steps to get there  Transfers                 General transfer comment: refused to try to get OOB  Ambulation/Gait                 Stairs             Wheelchair Mobility    Modified Rankin (Stroke Patients Only)       Balance                                            Cognition  Arousal/Alertness: Awake/alert Behavior During Therapy: Anxious Overall Cognitive Status: Impaired/Different from baseline Area of Impairment: Awareness;Problem solving;Safety/judgement;Following commands;Memory;Attention;Orientation                 Orientation Level: Time Current Attention Level: Selective Memory: Decreased recall of precautions Following Commands: Follows one step commands inconsistently;Follows one step commands with increased time Safety/Judgement: Decreased awareness of safety;Decreased awareness of deficits Awareness: Intellectual Problem Solving: Slow processing;Decreased initiation;Difficulty sequencing;Requires verbal cues;Requires tactile cues General Comments: fluctuating control of BLE's      Exercises General Exercises - Lower Extremity Ankle Circles/Pumps: Right;5 reps Quad Sets: AROM;10 reps Heel Slides: AROM;Right;10 reps Hip ABduction/ADduction: AAROM;Both;10 reps Straight Leg Raises: Both;AROM;AAROM;10 reps    General Comments General comments (skin integrity, edema, etc.): pt declined to get OOB and sit up nor would she follow through on more than helping to roll      Pertinent Vitals/Pain Pain Assessment: Faces Faces Pain Scale: Hurts little more Pain Location: LLE Pain Descriptors / Indicators: Guarding Pain Intervention(s): Limited activity within patient's tolerance;Repositioned    Home Living  Prior Function            PT Goals (current goals can now be found in the care plan section) Acute Rehab PT Goals Patient Stated Goal: to use the bedpan Progress towards PT goals: Not progressing toward goals - comment    Frequency    Min 3X/week      PT Plan Current plan remains appropriate    Co-evaluation              AM-PAC PT "6 Clicks" Mobility   Outcome Measure  Help needed turning from your back to your side while in a flat bed without using bedrails?: Total Help needed moving  from lying on your back to sitting on the side of a flat bed without using bedrails?: A Lot Help needed moving to and from a bed to a chair (including a wheelchair)?: Total Help needed standing up from a chair using your arms (e.g., wheelchair or bedside chair)?: Total Help needed to walk in hospital room?: Total Help needed climbing 3-5 steps with a railing? : Total 6 Click Score: 7    End of Session Equipment Utilized During Treatment: Oxygen Activity Tolerance: Treatment limited secondary to medical complications (Comment);Other (comment) Patient left: in bed;with call bell/phone within reach;with SCD's reapplied;with bed alarm set Nurse Communication: Mobility status;Need for lift equipment PT Visit Diagnosis: Pain;Muscle weakness (generalized) (M62.81);Repeated falls (R29.6);Dizziness and giddiness (R42) Pain - Right/Left: Left Pain - part of body: Leg     Time: 1001-1027 PT Time Calculation (min) (ACUTE ONLY): 26 min  Charges:  $Therapeutic Exercise: 8-22 mins $Therapeutic Activity: 8-22 mins                  Ramond Dial 05/28/2020, 10:22 PM  Mee Hives, PT MS Acute Rehab Dept. Number: [place] and [place]

## 2020-05-28 NOTE — TOC Progression Note (Signed)
Transition of Care [facility]) - Progression Note    Patient Details  Name: Katherine Benson MRN: [id number] Date of Birth: 1941/04/10  Transition of Care [facility]) CM/SW [place], [place] Phone Number: 05/28/2020, 12:40 PM  Clinical Narrative:    CSW awaits medical stability to assist w/ dc back to [facility]. Pt will need a new COVID swab prior to discharge.   Expected Discharge Plan: [place] Barriers to Discharge: Continued Medical Work up  Expected Discharge Plan and Services Expected Discharge Plan: [place] In-house Referral: Clinical Social Work Discharge Planning Services: CM Consult Post Acute Care Choice: [place], Resumption of Svcs/PTA Provider Living arrangements for the past 2 months: [place], [place]   Readmission Risk Interventions Readmission Risk Prevention Plan 05/25/2020  Transportation Screening Complete  PCP or Specialist Appt within 5-7 Days Not Complete  Not Complete comments plan for return to SNF  Home Care Screening Complete  Medication Review (RN CM) Referral to Pharmacy  Some recent data might be hidden

## 2020-05-28 NOTE — Progress Notes (Signed)
PROGRESS NOTE    Katherine Benson  ZOX:[id number] DOB: [birth year] DOA: 05/19/2020 PCP: [place], MD   Brief Narrative: Katherine Benson is a 79 y.o. female with a history of MGUS, Crohn's disease, diabetes mellitus, hypertension.  Patient presented secondary to fall was found to have comminuted tibial plateau fracture in setting of previous tibial fracture.  Orthopedic surgery was consulted and patient underwent ORIF on 9/9.  Hospitalization complicated by hyponatremia.   Assessment & Plan:   Principal Problem:   Fall at home, initial encounter Active Problems:   Unresponsive episode   Hypertension   MGUS (monoclonal gammopathy of unknown significance)   Crohn disease ([place])   Diabetes mellitus type 2 in obese ([place])   Tibial plateau fracture, left, closed, with delayed healing, subsequent encounter   Acute hyponatremia   Tibial plateau fracture Patient with previously known fracture that was managed non-operatively. On current admission, evidence of comminuted fracture. Orthopedic surgery consulted and patient underwent ORIF on 9/9. -Orthopedic surgery recommendations: NWB of LLE, hinge knee brace of LLE (locked in full extension), PRAFO boot as needed  Hyponatremia Unknown etiology. Lowest sodium of 126 with initial improvement on NS IV fluids. Urine sodium/osmolality obtained today suggests possible adrenal insufficiency. No associated hypotension noted but rather hypertension. Patient started on isotonic saline which appears to have worsened her sodium.  -Nephrology consulted and will see; preliminary recommendations are to hold Lexapro (discussed with son and he states that this is a relatively new prescription)  Unresponsiveness Accidental opioid overdose Occurred after Fentanyl during EMS transport. Patient underwent chest compressions and Narcan with resolution.  Confusion Improved. Concern there may be underlying dementia. Likely exacerbated by hyponatremia.  Essential  hypertension -Continue Diltiazem, losartan  MGUS Surveillance currently. Follows with hematology/oncology  Crohn disease Follows with GI as an outpatient. Currently not on medication.  Diabetes mellitus, type 2 Hemoglobin A1C of 6.1%. not on medication as an outpatient  Abdominal distension Likely secondary to constipation. Abdominal ultrasound obtained without etiology. -Continue Colace -Dulcolax suppository   DVT prophylaxis: Lovenox Code Status:   Code Status: Full Code Family Communication: Son on telephone Disposition Plan: Discharge to SNF likely in 2-3 days pending improvement of hyponatremia   Consultants:   Orthopedic surgery  Procedures:   LEFT ORIF (05/21/2020)  Antimicrobials:  Vancomycin/Clindamycin (Surgical Prophylaxis)    Subjective: Generally feels poor in relation to her being in the hospital for so long.  Objective: Vitals:   05/27/20 1348 05/27/20 2115 05/27/20 2206 05/28/20 0429  BP: (!) 165/97 (!) 191/102 (!) 160/70 (!) 154/87  Pulse: 90 86 97 84  Resp: 18 17 18 17   Temp: 98.1 F (36.7 C) 97.9 F (36.6 C) 98.4 F (36.9 C) 98.4 F (36.9 C)  TempSrc: Oral Oral Oral Oral  SpO2: 97% 100% 96% 100%  Weight:      Height:        Intake/Output Summary (Last 24 hours) at 05/28/2020 1336 Last data filed at 05/28/2020 0930 Gross per 24 hour  Intake 265 ml  Output 950 ml  Net -685 ml   Filed Weights   05/21/20 1215  Weight: 70.7 kg    Examination:  General exam: Appears calm and comfortable Respiratory system: Clear to auscultation. Respiratory effort normal. Cardiovascular system: S1 & S2 heard, RRR. No murmurs, rubs, gallops or clicks. Gastrointestinal system: Abdomen is distended, soft and minimally tender. No organomegaly or masses felt. Normal bowel sounds heard. Central nervous system: Alert and oriented. No focal neurological deficits. Musculoskeletal: No edema. No calf  tenderness Skin: No cyanosis. No rashes Psychiatry:  Judgement and insight appear normal. Flat affect.     Data Reviewed: I have personally reviewed following labs and imaging studies  CBC Lab Results  Component Value Date   WBC 5.8 05/27/2020   RBC 3.38 (L) 05/27/2020   HGB 10.0 (L) 05/27/2020   HCT 30.8 (L) 05/27/2020   MCV 91.1 05/27/2020   MCH 29.6 05/27/2020   PLT 325 05/27/2020   MCHC 32.5 05/27/2020   RDW 13.4 05/27/2020   LYMPHSABS 0.6 (L) 05/23/2020   MONOABS 0.7 05/23/2020   EOSABS 0.1 05/23/2020   BASOSABS 0.0 63/14/9702     Last metabolic panel Lab Results  Component Value Date   NA 123 (L) 05/28/2020   K 3.5 05/28/2020   CL 83 (L) 05/28/2020   CO2 28 05/28/2020   BUN 7 (L) 05/28/2020   CREATININE 0.54 05/28/2020   GLUCOSE 100 (H) 05/28/2020   GFRNONAA >60 05/28/2020   GFRAA >60 05/28/2020   CALCIUM 8.7 (L) 05/28/2020   PHOS 3.5 05/27/2020   ANIONGAP 12 05/28/2020    CBG (last 3)  No results for input(s): GLUCAP in the last 72 hours.   GFR: Estimated Creatinine Clearance: 51.7 mL/min (by C-G formula based on SCr of 0.54 mg/dL).  Coagulation Profile: No results for input(s): INR, PROTIME in the last 168 hours.  Recent Results (from the past 240 hour(s))  SARS Coronavirus 2 by RT PCR (hospital order, performed in [facility] hospital lab) Nasopharyngeal Nasopharyngeal Swab     Status: None   Collection Time: 05/19/20  6:50 AM   Specimen: Nasopharyngeal Swab  Result Value Ref Range Status   SARS Coronavirus 2 NEGATIVE NEGATIVE Final    Comment: (NOTE) SARS-CoV-2 target nucleic acids are NOT DETECTED.  The SARS-CoV-2 RNA is generally detectable in upper and lower respiratory specimens during the acute phase of infection. The lowest concentration of SARS-CoV-2 viral copies this assay can detect is 250 copies / mL. A negative result does not preclude SARS-CoV-2 infection and should not be used as the sole basis for treatment or other patient management decisions.  A negative result may occur  with improper specimen collection / handling, submission of specimen other than nasopharyngeal swab, presence of viral mutation(s) within the areas targeted by this assay, and inadequate number of viral copies (<250 copies / mL). A negative result must be combined with clinical observations, patient history, and epidemiological information.  Fact Sheet for Patients:   StrictlyIdeas.no  Fact Sheet for Healthcare Providers: BankingDealers.co.za  This test is not yet approved or  cleared by the [place] FDA and has been authorized for detection and/or diagnosis of SARS-CoV-2 by FDA under an Emergency Use Authorization (EUA).  This EUA will remain in effect (meaning this test can be used) for the duration of the COVID-19 declaration under Section 564(b)(1) of the Act, 21 U.S.C. section 360bbb-3(b)(1), unless the authorization is terminated or revoked sooner.  Performed at [facility], [place] [address] [place], [place] [address]   Surgical pcr screen     Status: None   Collection Time: 05/20/20  3:11 PM   Specimen: Nasal Mucosa; Nasal Swab  Result Value Ref Range Status   MRSA, PCR NEGATIVE NEGATIVE Final   Staphylococcus aureus NEGATIVE NEGATIVE Final    Comment: (NOTE) The Xpert SA Assay (FDA approved for NASAL specimens in patients 22 years of age and older), is one component of a comprehensive surveillance program. It is not intended to diagnose  infection nor to guide or monitor treatment. Performed at [facility], [place] [address] [place], [place] [address]   SARS Coronavirus 2 by RT PCR (hospital order, performed in [facility] hospital lab) Nasopharyngeal Nasopharyngeal Swab     Status: None   Collection Time: 05/24/20  7:29 AM   Specimen: Nasopharyngeal Swab  Result Value Ref Range Status   SARS Coronavirus 2 NEGATIVE NEGATIVE Final    Comment: (NOTE) SARS-CoV-2 target nucleic acids are NOT DETECTED.  The  SARS-CoV-2 RNA is generally detectable in upper and lower respiratory specimens during the acute phase of infection. The lowest concentration of SARS-CoV-2 viral copies this assay can detect is 250 copies / mL. A negative result does not preclude SARS-CoV-2 infection and should not be used as the sole basis for treatment or other patient management decisions.  A negative result may occur with improper specimen collection / handling, submission of specimen other than nasopharyngeal swab, presence of viral mutation(s) within the areas targeted by this assay, and inadequate number of viral copies (<250 copies / mL). A negative result must be combined with clinical observations, patient history, and epidemiological information.  Fact Sheet for Patients:   StrictlyIdeas.no  Fact Sheet for Healthcare Providers: BankingDealers.co.za  This test is not yet approved or  cleared by the [place] FDA and has been authorized for detection and/or diagnosis of SARS-CoV-2 by FDA under an Emergency Use Authorization (EUA).  This EUA will remain in effect (meaning this test can be used) for the duration of the COVID-19 declaration under Section 564(b)(1) of the Act, 21 U.S.C. section 360bbb-3(b)(1), unless the authorization is terminated or revoked sooner.  Performed at [facility], [place] [address], [place], [place] [address]         Radiology Studies: US Abdomen Complete  Result Date: 05/26/2020 CLINICAL DATA:  Abdominal pain EXAM: ABDOMEN ULTRASOUND COMPLETE COMPARISON:  None. FINDINGS: Gallbladder: Sludge is noted within the gallbladder. There are no evident gallstones, gallbladder wall thickening, or pericholecystic fluid. No sonographic Murphy sign noted by sonographer. Common bile duct: Diameter: A small portion of the common bile duct measures 2 mm. Most of common bile duct is obscured by gas. No intrahepatic biliary duct dilatation is  evident. Liver: No focal lesion identified. Liver echogenicity overall is increased. Portal vein is patent on color Doppler imaging with normal direction of blood flow towards the liver. IVC: Inferior vena cava is virtually completely obscured by gas. Pancreas: Visualized portion unremarkable. Much of pancreas obscured by gas. Spleen: Spleen visualization limited due to body habitus. Visualized portion of spleen unremarkable. Right Kidney: Length: 9.2 cm. Echogenicity within normal limits. No mass or hydronephrosis visualized. Left Kidney: Length: 9.3 cm. Echogenicity within normal limits. No mass or hydronephrosis visualized. Abdominal aorta: No aneurysm visualized in visualized regions. Note that portions of the aorta not visualized. Other findings: No evident ascites. IMPRESSION: 1. Somewhat limited study due to bowel gas and patient inability to suspend respiration. 2. Sludge within gallbladder. No gallstones, gallbladder wall thickening, or pericholecystic fluid appreciable. 3. Increased liver echogenicity, a finding felt to be indicative of hepatic steatosis. No focal liver lesions evident. Note that the sensitivity of ultrasound for detection of focal liver lesions is diminished in this circumstance. 4. Much of pancreas obscured by gas. Spleen not well visualized due to body habitus and difficulty suspending respiration. Most of inferior vena cava obscured by gas. Portions of aorta obscured by gas. Electronically Signed   By: Lowella Grip III M.D.  On: 05/26/2020 16:23   DG Abd Portable 1V  Result Date: 05/28/2020 CLINICAL DATA:  Abdominal distension EXAM: PORTABLE ABDOMEN - 1 VIEW COMPARISON:  May 19, 2020 FINDINGS: Air and stool-filled nondilated loops of bowel. Mild colonic stool burden predominately within the rectum. Status post total LEFT hip arthroplasty. Degenerative changes of the lumbar spine. Incomplete assessment of the LEFT lateral soft tissues. IMPRESSION: Mild colonic stool  burden predominately within the rectum. Nonobstructive bowel gas pattern. Electronically Signed   By: Valentino Saxon MD   On: 05/28/2020 12:31        Scheduled Meds: . acetaminophen  650 mg Oral Q8H  . vitamin C  500 mg Oral Daily  . aspirin  325 mg Oral Daily  . bisacodyl  10 mg Rectal Once  . carbamazepine  200 mg Oral BID  . cholecalciferol  2,000 Units Oral BID  . dicyclomine  10 mg Oral BID AC & HS  . diltiazem  180 mg Oral QHS  . docusate sodium  100 mg Oral BID  . enoxaparin (LOVENOX) injection  40 mg Subcutaneous Q24H  . loratadine  10 mg Oral Daily  . losartan  100 mg Oral Daily  . multivitamin  1 tablet Oral Daily  . pantoprazole  40 mg Oral Daily  . polyethylene glycol  17 g Oral BID  . polyvinyl alcohol  1 drop Both Eyes BID  . triamcinolone  1 spray Nasal BID   Continuous Infusions: . methocarbamol (ROBAXIN) IV       LOS: 8 days     Cordelia Poche, MD [facility] 05/28/2020, 1:36 PM  If 7PM-7AM, please contact night-coverage [email or web address]

## 2020-05-28 NOTE — Progress Notes (Signed)
Received critical lab result-osmolality-242. Notified on call M. Denny,NP via text page with no new orders.

## 2020-05-28 NOTE — Progress Notes (Signed)
Received critical lab result-sodium--116. Notified on call M. Denny,NP via text page.

## 2020-05-29 DIAGNOSIS — L8990 Pressure ulcer of unspecified site, unspecified stage: Secondary | ICD-10-CM | POA: Insufficient documentation

## 2020-05-29 LAB — SODIUM
Sodium: 115 mmol/L — CL (ref 135–145)
Sodium: 115 mmol/L — CL (ref 135–145)
Sodium: 116 mmol/L — CL (ref 135–145)
Sodium: 117 mmol/L — CL (ref 135–145)
Sodium: 119 mmol/L — CL (ref 135–145)
Sodium: 118 mmol/L — CL (ref 135–145)
Sodium: 119 mmol/L — CL (ref 135–145)

## 2020-05-29 LAB — BASIC METABOLIC PANEL
Anion gap: 7 (ref 5–15)
BUN: 7 mg/dL — ABNORMAL LOW (ref 8–23)
CO2: 27 mmol/L (ref 22–32)
Calcium: 8.5 mg/dL — ABNORMAL LOW (ref 8.9–10.3)
Chloride: 82 mmol/L — ABNORMAL LOW (ref 98–111)
Creatinine, Ser: 0.51 mg/dL (ref 0.44–1.00)
GFR calc Af Amer: >60 mL/min (ref >60)
GFR calc non Af Amer: >60 mL/min (ref >60)
Glucose, Bld: 108 mg/dL — ABNORMAL HIGH (ref 70–99)
Potassium: 3.6 mmol/L (ref 3.5–5.1)
Sodium: 116 mmol/L — CL (ref 135–145)

## 2020-05-29 LAB — ACTH: C206 ACTH: 4.5 pg/mL — ABNORMAL LOW (ref 7.2–63.3)

## 2020-05-29 LAB — OSMOLALITY: Osmolality: 242 mOsm/kg — CL (ref 275–295)

## 2020-05-29 MED ORDER — CHLORHEXIDINE GLUCONATE CLOTH 2 % EX PADS
6.0000 | MEDICATED_PAD | Freq: Every day | CUTANEOUS | Status: DC
  Administered 2020-05-29 – 2020-06-05 (×7): 6 via TOPICAL

## 2020-05-29 MED ORDER — SODIUM CHLORIDE 3 % IV SOLN
INTRAVENOUS | Status: DC
  Filled 2020-05-29 (×3): qty 500

## 2020-05-29 NOTE — Progress Notes (Signed)
PROGRESS NOTE    Katherine Benson  PPJ:[id number] DOB: [birth year] DOA: 05/19/2020 PCP: Heber [place], MD   Brief Narrative: Katherine Benson is a 79 y.o. female with a history of MGUS, Crohn's disease, diabetes mellitus, hypertension.  Patient presented secondary to fall was found to have comminuted tibial plateau fracture in setting of previous tibial fracture.  Orthopedic surgery was consulted and patient underwent ORIF on 9/9.  Hospitalization complicated by hyponatremia.   Assessment & Plan:   Principal Problem:   Fall at home, initial encounter Active Problems:   Unresponsive episode   Hypertension   MGUS (monoclonal gammopathy of unknown significance)   Crohn disease ([place])   Diabetes mellitus type 2 in obese ([place])   Tibial plateau fracture, left, closed, with delayed healing, subsequent encounter   Acute hyponatremia   Tibial plateau fracture Patient with previously known fracture that was managed non-operatively. On current admission, evidence of comminuted fracture. Orthopedic surgery consulted and patient underwent ORIF on 9/9. 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 Urine sodium/osmolality obtained suggests possible adrenal insufficiency but ACTH stimulation test was negative. No associated hypotension noted but rather hypertension. Overnight, sodium worsened significantly to a low of 116. -Nephrology recommendations: hypertonic saline (3%) @ 20 ml/hr  Unresponsiveness Accidental opioid overdose Occurred after Fentanyl during EMS transport. Patient underwent chest compressions and Narcan with resolution.  Confusion Concern there may be underlying dementia. Likely exacerbated by hyponatremia. Initially improved and has worsened slightly with worsened sodium. Anticipate this will improve  Essential  hypertension -Continue Diltiazem, losartan  MGUS Surveillance currently. Follows with hematology/oncology  Crohn disease Follows with GI as an outpatient. Currently not on medication.  Diabetes mellitus, type 2 Hemoglobin A1C of 6.1%. not on medication as an outpatient  Abdominal distension Likely secondary to constipation. Abdominal ultrasound obtained without etiology. -Continue Colace -Dulcolax suppository   DVT prophylaxis: Lovenox Code Status:   Code Status: Full Code Family Communication: Son on telephone but no answer Disposition Plan: Transfer to progressive unit. Discharge to SNF likely in 3-5 days pending improvement of hyponatremia   Consultants:   Orthopedic surgery  Procedures:   LEFT ORIF (05/21/2020)  Antimicrobials:  Vancomycin/Clindamycin (Surgical Prophylaxis)    Subjective: Feels foggy headed. No nausea or vomiting. No other concerns  Objective: Vitals:   05/28/20 1858 05/28/20 2016 05/29/20 0449 05/29/20 1031  BP: (!) 167/100 (!) 144/73 (!) 158/86 (!) 169/85  Pulse: 98 (!) 102 (!) 105 99  Resp:  20 20 (!) 26  Temp:  98.6 F (37 C) 98 F (36.7 C) 98.2 F (36.8 C)  TempSrc:    Oral  SpO2:  96% 96% 96%  Weight:      Height:        Intake/Output Summary (Last 24 hours) at 05/29/2020 1040 Last data filed at 05/29/2020 0543 Gross per 24 hour  Intake 290 ml  Output 852 ml  Net -562 ml   Filed Weights   05/21/20 1215  Weight: 70.7 kg    Examination:  General exam: Appears calm and comfortable Respiratory system: Clear to auscultation. Respiratory effort normal. Cardiovascular system: S1 & S2 heard, RRR. No murmurs, rubs, gallops or clicks. Gastrointestinal system: Abdomen is distended, soft and nontender. No organomegaly or masses felt. Normal bowel sounds heard. Central nervous system: Alert and oriented to person. Appears confused when trying to answer questions. Musculoskeletal: No calf tenderness Skin: No cyanosis.  No  rashes Psychiatry: Judgement and insight appear normal. Mood & affect appropriate.     Data Reviewed: I have personally reviewed following labs and imaging studies  CBC Lab Results  Component Value Date   WBC 5.8 05/27/2020   RBC 3.38 (L) 05/27/2020   HGB 10.0 (L) 05/27/2020   HCT 30.8 (L) 05/27/2020   MCV 91.1 05/27/2020   MCH 29.6 05/27/2020   PLT 325 05/27/2020   MCHC 32.5 05/27/2020   RDW 13.4 05/27/2020   LYMPHSABS 0.6 (L) 05/23/2020   MONOABS 0.7 05/23/2020   EOSABS 0.1 05/23/2020   BASOSABS 0.0 74/94/4967     Last metabolic panel Lab Results  Component Value Date   NA 115 (LL) 05/29/2020   K 3.6 05/29/2020   CL 82 (L) 05/29/2020   CO2 27 05/29/2020   BUN 7 (L) 05/29/2020   CREATININE 0.51 05/29/2020   GLUCOSE 108 (H) 05/29/2020   GFRNONAA >60 05/29/2020   GFRAA >60 05/29/2020   CALCIUM 8.5 (L) 05/29/2020   PHOS 3.5 05/27/2020   ANIONGAP 7 05/29/2020    CBG (last 3)  No results for input(s): GLUCAP in the last 72 hours.   GFR: Estimated Creatinine Clearance: 51.7 mL/min (by C-G formula based on SCr of 0.51 mg/dL).  Coagulation Profile: No results for input(s): INR, PROTIME in the last 168 hours.  Recent Results (from the past 240 hour(s))  Surgical pcr screen     Status: None   Collection Time: 05/20/20  3:11 PM   Specimen: Nasal Mucosa; Nasal Swab  Result Value Ref Range Status   MRSA, PCR NEGATIVE NEGATIVE Final   Staphylococcus aureus NEGATIVE NEGATIVE Final    Comment: (NOTE) The Xpert SA Assay (FDA approved for NASAL specimens in patients 33 years of age and older), is one component of a comprehensive surveillance program. It is not intended to diagnose infection nor to guide or monitor treatment. Performed at [facility], [place] [address], [place], [place] [address]   SARS Coronavirus 2 by RT PCR (hospital order, performed in [facility] hospital lab) Nasopharyngeal Nasopharyngeal Swab     Status: None   Collection Time: 05/24/20   7:29 AM   Specimen: Nasopharyngeal Swab  Result Value Ref Range Status   SARS Coronavirus 2 NEGATIVE NEGATIVE Final    Comment: (NOTE) SARS-CoV-2 target nucleic acids are NOT DETECTED.  The SARS-CoV-2 RNA is generally detectable in upper and lower respiratory specimens during the acute phase of infection. The lowest concentration of SARS-CoV-2 viral copies this assay can detect is 250 copies / mL. A negative result does not preclude SARS-CoV-2 infection and should not be used as the sole basis for treatment or other patient management decisions.  A negative result may occur with improper specimen collection / handling, submission of specimen other than nasopharyngeal swab, presence of viral mutation(s) within the areas targeted by this assay, and inadequate number of viral copies (<250 copies / mL). A negative result must be combined with clinical observations, patient history, and epidemiological information.  Fact Sheet for Patients:   StrictlyIdeas.no  Fact Sheet for Healthcare Providers: BankingDealers.co.za  This test is not yet approved or  cleared by the [place] FDA and has been authorized for detection and/or diagnosis of SARS-CoV-2 by FDA under an Emergency Use Authorization (EUA).  This EUA will remain in effect (meaning this test can be used) for the duration of the COVID-19 declaration under Section 564(b)(1) of the Act, 21 U.S.C. section 360bbb-3(b)(1), unless the authorization is  terminated or revoked sooner.  Performed at [facility], [place] [address] [place], [place] [address]         Radiology Studies: DG Abd Portable 1V  Result Date: 05/28/2020 CLINICAL DATA:  Abdominal distension EXAM: PORTABLE ABDOMEN - 1 VIEW COMPARISON:  May 19, 2020 FINDINGS: Air and stool-filled nondilated loops of bowel. Mild colonic stool burden predominately within the rectum. Status post total LEFT hip arthroplasty.  Degenerative changes of the lumbar spine. Incomplete assessment of the LEFT lateral soft tissues. IMPRESSION: Mild colonic stool burden predominately within the rectum. Nonobstructive bowel gas pattern. Electronically Signed   By: Valentino Saxon MD   On: 05/28/2020 12:31        Scheduled Meds: . acetaminophen  650 mg Oral Q8H  . vitamin C  500 mg Oral Daily  . aspirin  325 mg Oral Daily  . cholecalciferol  2,000 Units Oral BID  . dicyclomine  10 mg Oral BID AC & HS  . diltiazem  180 mg Oral QHS  . docusate sodium  100 mg Oral BID  . enoxaparin (LOVENOX) injection  40 mg Subcutaneous Q24H  . loratadine  10 mg Oral Daily  . losartan  100 mg Oral Daily  . multivitamin  1 tablet Oral Daily  . nutrition supplement (JUVEN)  1 packet Oral BID BM  . pantoprazole  40 mg Oral Daily  . polyethylene glycol  17 g Oral BID  . polyvinyl alcohol  1 drop Both Eyes BID  . triamcinolone  1 spray Nasal BID   Continuous Infusions: . methocarbamol (ROBAXIN) IV    . sodium chloride (hypertonic)       LOS: 9 days     Cordelia Poche, MD [facility] 05/29/2020, 10:40 AM  If 7PM-7AM, please contact night-coverage [email or web address]

## 2020-05-29 NOTE — Progress Notes (Signed)
Received critical lab result for sodium-119. Provider R. Nettey notified.

## 2020-05-29 NOTE — Progress Notes (Signed)
NAME:  Katherine Benson, MRN:  [id number], DOB:  Jan 18, 1941, LOS: 9 ADMISSION DATE:  05/19/2020, CONSULTATION DATE:  05/29/2020 REFERRING MD:  Dr. Lonny Prude, CHIEF COMPLAINT:  Hyponatremia    Brief History   79yo female presented after fall at home that resulted in communicated tibial plateau fracture, underwent ORIF 9/9. PCCM consulted 9/17 due to significant hyponatremia requiring initiation of hypertonic saline and close monitoring in the ICU setting.   History of present illness   Katherine Benson is a 79 y.o. female with PMX significant for MGUS, HTN, Type 2 diabetes, and crohn's disease who presented to ED after suffering fall at rehab facility that resulted in repeat fracture of left communicated tibial plateau fracture. Of note patient was recently seen and treated for similar fracture to left tibial at OSH 3 weeks ago and discharged to SNF at that time. Patient underwent ORIF at this facility 9/9.  During hospitalization course she suffered accidental opoid overdose with EMS per chart review that was treated with 4 mins of CPR and narcan. Additionally patient has been receiving treatment for hyponatremia. Nephrology consulted 9/17 and diagnosed with moderate SIADH though to be driven by SSRI and AED coupled with nausea. Nephrology requested initiation of hypertonic saline and therefore PCCM was consulted given need for higher level of care.   Past Medical History  MGUS, HTN, Type 2 diabetes, and crohn's disease  Significant Hospital Events   Admitted 9/7  Consults:  Nephrology  Ortho   Procedures:  ORIF to left tibal fracture 9/9  Significant Diagnostic Tests:  KUB 9/16 > moderate stool burden   Micro Data:  COVID 9/7 > Negative  Surgical PCR 9/8 > Negative  COVID 9/12 > Negative   Antimicrobials:     Interim history/subjective:  Sitting up in bed in no acute distress, states she feels well. Denies and acute complaints   Objective   Blood pressure (!) 169/85, pulse 99,  temperature 99.3 F (37.4 C), temperature source Axillary, resp. rate 20, height 5' (1.524 m), weight 70.7 kg, SpO2 96 %.        Intake/Output Summary (Last 24 hours) at 05/29/2020 1320 Last data filed at 05/29/2020 0930 Gross per 24 hour  Intake 120 ml  Output 1001 ml  Net -881 ml   Filed Weights   05/21/20 1215  Weight: 70.7 kg    Examination: General: very pleasant elderly female lying in bed in NAD HEENT: [place]/AT, MM pink/moist, PERRL,  Neuro: Alert and oriented x2, disoriented to situation, able to follow all commands. Non-focal  CV: s1s2 regular rate and rhythm, no murmur, rubs, or gallops,  PULM:  Clear to ascultation bilaterally, no added breath sounds GI: soft, bowel sounds active in all 4 quadrants, non-tender, non-distended  Extremities: warm/dry, no edema  Skin: no rashes or lesions  Resolved Hospital Problem list     Assessment & Plan:  Moderate SIADH -Thought to be driven by SSRI/AED coupled with pain and nausea  -ACTH stimulation test negative  Significant hyponatremia  -Sodium as low as 115 on 9/17 AMS  -Likley secondary to profound hyponatremia  P: Nephrology following  Begin hypertonic saline when transferred to ICU  Sodium checks q2hrs SSRI and Carbamazepine on hold  1 liter fluid restriction Delirium precautions   Accidental opoid overdose  -During hospitalization course she suffered accidental opoid overdose with EMS per chart review that was treated with 4 mins of CPR and narcan P: Avoid IV opoid's    Rest of acute and chronic medical conditions managed  per primary team.  Best practice:  Diet: regular with 1L fluid restriction Pain/Anxiety/Delirium protocol (if indicated): As needed  VAP protocol (if indicated): N/A DVT prophylaxis: Lovenox  GI prophylaxis: PPI Glucose control: SSI Mobility: Bedrest  Code Status: Full Family Communication: Per primary  Disposition: ICU  Labs   CBC: Recent Labs  Lab 05/23/20 0411 05/24/20 0150  05/26/20 0506 05/27/20 0356  WBC 6.6 5.4  --  5.8  NEUTROABS 5.2  --   --   --   HGB 8.8* 8.4* 8.9* 10.0*  HCT 26.1* 26.1* 27.4* 30.8*  MCV 92.6 90.6  --  91.1  PLT 239 242  --  149    Basic Metabolic Panel: Recent Labs  Lab 05/25/20 1123 05/25/20 1123 05/26/20 0506 05/26/20 0506 05/27/20 0356 05/27/20 1818 05/28/20 0647 05/28/20 1448 05/28/20 1633 05/28/20 2148 05/29/20 0403 05/29/20 0739 05/29/20 1027  NA 128*   < > 127*   < > 127*   < > 123*   < > 120* 116* 116* 115* 115*  K 3.5  --  3.4*  --  3.4*  --  3.5  --   --   --  3.6  --   --   CL 92*  --  92*  --  91*  --  83*  --   --   --  82*  --   --   CO2 26  --  28  --  28  --  28  --   --   --  27  --   --   GLUCOSE 113*  --  105*  --  98  --  100*  --   --   --  108*  --   --   BUN 5*  --  5*  --  5*  --  7*  --   --   --  7*  --   --   CREATININE 0.45  --  0.52  --  0.52  --  0.54  --   --   --  0.51  --   --   CALCIUM 8.3*  --  8.4*  --  8.7*  --  8.7*  --   --   --  8.5*  --   --   MG  --   --   --   --  1.8  --   --   --   --   --   --   --   --   PHOS  --   --   --   --  3.5  --   --   --   --   --   --   --   --    < > = values in this interval not displayed.   GFR: Estimated Creatinine Clearance: 51.7 mL/min (by C-G formula based on SCr of 0.51 mg/dL). Recent Labs  Lab 05/23/20 0411 05/24/20 0150 05/27/20 0356  WBC 6.6 5.4 5.8    Liver Function Tests: No results for input(s): AST, ALT, ALKPHOS, BILITOT, PROT, ALBUMIN in the last 168 hours. No results for input(s): LIPASE, AMYLASE in the last 168 hours. No results for input(s): AMMONIA in the last 168 hours.  ABG No results found for: PHART, PCO2ART, PO2ART, HCO3, TCO2, ACIDBASEDEF, O2SAT   Coagulation Profile: No results for input(s): INR, PROTIME in the last 168 hours.  Cardiac Enzymes: No results for input(s): CKTOTAL, CKMB, CKMBINDEX, TROPONINI in the last 168  hours.  HbA1C: Hgb A1c MFr Bld  Date/Time Value Ref Range Status  05/20/2020  06:29 PM 6.1 (H) 4.8 - 5.6 % Final    Comment:    (NOTE) Pre diabetes:          5.7%-6.4%  Diabetes:              >6.4%  Glycemic control for   <7.0% adults with diabetes     CBG: No results for input(s): GLUCAP in the last 168 hours.  Review of Systems:   Unable to assess given AMS  Past Medical History  She,  has a past medical history of Crohn disease ([place]), Diabetes mellitus type 2 in obese ([place]), Hypertension, and MGUS (monoclonal gammopathy of unknown significance).   Surgical History    Past Surgical History:  Procedure Laterality Date   ORIF TIBIA PLATEAU Left 05/21/2020   Procedure: LEFT OPEN REDUCTION INTERNAL FIXATION (ORIF) TIBIAL PLATEAU;  Surgeon: Shona Needles, MD;  Location: [place];  Service: Orthopedics;  Laterality: Left;     Social History   reports that she has never smoked. She has never used smokeless tobacco. She reports that she does not drink alcohol and does not use drugs.   Family History   Her family history is not on file.   Allergies Allergies  Allergen Reactions   Cefaclor Rash   Mercaptopurine Other (See Comments)    Elevated liver tests Elevated liver tests Elevated liver tests    Penicillins Rash   Sulfa Antibiotics Rash   Erythromycin      Home Medications  Prior to Admission medications   Medication Sig Start Date End Date Taking? Authorizing Provider  acetaminophen (TYLENOL) 500 MG tablet Take 1,000 mg by mouth every 4 (four) hours as needed for mild pain or fever.   Yes [provider]  aspirin 325 MG EC tablet Take 325 mg by mouth daily. 04/10/20 04/10/21 Yes [provider]  Biotin (SUPER BIOTIN) 5 MG TABS Take 5 mg by mouth daily.    Yes [provider]  calcium citrate (CALCITRATE - DOSED IN MG ELEMENTAL CALCIUM) 950 (200 Ca) MG tablet Take 200 mg of elemental calcium by mouth 2 (two) times daily.   Yes [provider]  carbamazepine (TEGRETOL) 200 MG tablet Take 200 mg by mouth 2  (two) times daily. 05/08/20  Yes [provider]  cetirizine (ZYRTEC) 10 MG tablet Take 10 mg by mouth daily.   Yes [provider]  Cholecalciferol (VITAMIN D) 50 MCG (2000 UT) tablet Take 2,000 Units by mouth daily.   Yes [provider]  dicyclomine (BENTYL) 10 MG capsule Take 10 mg by mouth 2 (two) times daily at 8 am and 10 pm.  10/11/18  Yes [provider]  diltiazem (TIADYLT ER) 180 MG 24 hr capsule Take 180 mg by mouth at bedtime.   Yes [provider]  docusate sodium (COLACE) 100 MG capsule Take 100 mg by mouth daily as needed for mild constipation.    Yes [provider]  escitalopram (LEXAPRO) 10 MG tablet Take 10 mg by mouth daily.   Yes [provider]  Multiple Vitamins-Minerals (PRESERVISION AREDS 2) CAPS Take 1 capsule by mouth daily.   Yes [provider]  omeprazole (PRILOSEC) 40 MG capsule Take 40 mg by mouth daily.  11/01/18  Yes [provider]  ondansetron (ZOFRAN-ODT) 4 MG disintegrating tablet Take 4-8 mg by mouth every 8 (eight) hours as needed for nausea or vomiting.  Yes [provider]  Polyethyl Glycol-Propyl Glycol (SYSTANE) 0.4-0.3 % SOLN Place 1 drop into both eyes in the morning and at bedtime.   Yes [provider]  Probiotic Product (PROBIOTIC DAILY PO) Take 1 capsule by mouth daily. 1.5 billion cell cap   Yes [provider]  sennosides-docusate sodium (SENOKOT-S) 8.6-50 MG tablet Take 1 tablet by mouth in the morning and at bedtime.   Yes [provider]  triamcinolone (NASACORT) 55 MCG/ACT AERO nasal inhaler Place 1 spray into the nose 2 (two) times daily.    Yes [provider]  ciprofloxacin (CIPRO) 500 MG tablet Take 1,000 mg by mouth See admin instructions. 1 hr before dental appointment    [provider]  denosumab (PROLIA) 60 MG/ML SOSY injection Inject into the skin.    [provider]  enoxaparin (LOVENOX) 40  MG/0.4ML injection Inject 0.4 mLs (40 mg total) into the skin daily. 05/23/20 06/22/20  Delray Alt, PA-C  HYDROcodone-acetaminophen (NORCO/VICODIN) 5-325 MG tablet Take 1 tablet by mouth every 6 (six) hours as needed for severe pain. 05/22/20   Delray Alt, PA-C     Critical care time:   CRITICAL CARE Performed by: Johnsie Cancel  Total critical care time: 38 minutes  Critical care time was exclusive of separately billable procedures and treating other patients.  Critical care was necessary to treat or prevent imminent or life-threatening deterioration.  Critical care was time spent personally by me on the following activities: development of treatment plan with patient and/or surrogate as well as nursing, discussions with consultants, evaluation of patient's response to treatment, examination of patient, obtaining history from patient or surrogate, ordering and performing treatments and interventions, ordering and review of laboratory studies, ordering and review of radiographic studies, pulse oximetry and re-evaluation of patient's condition.  Johnsie Cancel, NP-C [facility] Contact / Pager information can be found on Amion  05/29/2020, 1:48 PM

## 2020-05-29 NOTE — Progress Notes (Addendum)
[facility] Progress Note  Name: Edward Trevino MRN: [id number] DOB: [birth year]   Subjective:  Interim drop in sodium.  Per primary team discussion with son lexapro is new medication.  Spoke with her husband to update him.  She has been more confused today per her team.   Review of systems:  Limited by AMS; denies shortness of breath and no current n/v ----------- Background on consult:  8F admitted on 9/7 after having a fall and cardiac arrest at the nursing home. Before that she had been admitted to an outside facility for a left tibial plateau fracture related to a fall. She recovered quickly with CPR at the nursing facility, thought to be related to fentanyl administration. She was found to have an unstable fracture with her evaluation here and has subsequently undergone ORIF with orthopedics.  Other past history includes MGUS, IBD/Crohn's, DM 2, hypertension. She is on carbamazepine for trigeminal neuralgia started in 2020.  Patient has a normal serum sodium as recently as 04/02/2020 at a value of 136 and in February of this year was 137.   Intake/Output Summary (Last 24 hours) at 05/29/2020 0927 Last data filed at 05/29/2020 0543 Gross per 24 hour  Intake 530 ml  Output 852 ml  Net -322 ml    Vitals:  Vitals:   05/28/20 1525 05/28/20 1858 05/28/20 2016 05/29/20 0449  BP: (!) 151/114 (!) 167/100 (!) 144/73 (!) 158/86  Pulse: 88 98 (!) 102 (!) 105  Resp: 18  20 20   Temp: 97.6 F (36.4 C)  98.6 F (37 C) 98 F (36.7 C)  TempSrc: Oral     SpO2: 99%  96% 96%  Weight:      Height:         Physical Exam:  General elderly female in bed in no acute distress at rest HEENT normocephalic atraumatic  Neck supple trachea midline Lungs clear to auscultation bilaterally; unlabored on 2 liters  Heart tachy; S1S2 no rub  Abdomen soft nontender nondistended  Extremities trace to 1+ edema; left leg immobilized Neuro - awake but disoriented.  Oriented to person and  location of [facility] but can't state that we're in [facility] or a hospital and doesn't now year or month   Medications reviewed    Labs:  BMP Latest Ref Rng & Units 05/29/2020 05/29/2020 05/28/2020  Glucose 70 - 99 mg/dL - 108(H) -  BUN 8 - 23 mg/dL - 7(L) -  Creatinine 0.44 - 1.00 mg/dL - 0.51 -  Sodium 135 - 145 mmol/L 115(LL) 116(LL) 116(LL)  Potassium 3.5 - 5.1 mmol/L - 3.6 -  Chloride 98 - 111 mmol/L - 82(L) -  CO2 22 - 32 mmol/L - 27 -  Calcium 8.9 - 10.3 mg/dL - 8.5(L) -     Assessment/Plan:   1. SIADH, moderate: Driven by SSRI, AED coupled with pain/nausea as well   - Team is moving to a higher level of care.  Will start 3% saline at 20 ml/hr  - Check sodium every 2 hours for now x 24 hours   - Please remain off of SSRI  - Please remain off of carbamazepine  - Fluid restrictions in place for 1 liter a day for now - assess    2.  AMS  - Secondary to hyponatremia - starting 3% as above  3. Recent left tibial plateau fracture status post ORIF  4. IBD/Crohn's - noted.  Monitor for changes in bowel movements   5.  HTN - monitor trends.  Defer additional med adjustments today - starting 3% saline    6. DM - glucose controlled; per primary team   Spoke with Dr. Lonny Prude and he is transferring her to a higher level of care.   Claudia Desanctis, MD 05/29/2020 9:47 AM  Addendum:  On 3% at 20 ml/hr and Na 117.  Stop 3% saline when Na is 120 or greater. [facility] nephrology if sodium drops to 115 or less or rises to 123 or above before 7:00 am on 9/18  Nursing communication order placed and spoke with critical care  Claudia Desanctis, MD 5:34 PM 05/29/2020

## 2020-05-29 NOTE — Significant Event (Signed)
Rapid Response Event Note   Reason for Call :  Starting hypertonic saline for low sodium  Initial Focused Assessment:  Patient is alert and oriented x2,  Person and place.  She is disoriented on time and situation. Lungs sounds with scattered crackles Skin is warm to the touch  BP 169/85  SR 99  RR 26  O2 sat 96% on 2L [place].  Oral temp 98.2    Interventions:  Hypertonic saline started at 20cc/hr 2nd PIV started right wrist:  22ga  Plan of Care:  Neuro check q1 hour Transfer to ICU   Event Summary:   MD Notified: Dr Teryl Lucy at bedside Call Time:  Arrival Time: [place] Time: [place]  Raliegh Ip, RN

## 2020-05-29 NOTE — Progress Notes (Signed)
Physical Therapy Treatment Patient Details Name: Katherine Benson MRN: [id number] DOB: [birth year] Today's Date: 05/29/2020    History of Present Illness Patient is a 79 y/o female who presents after fall at rehab, + CPR after given opiod and s/p narcan. CT-comminuted fx of left proximal tibia, lateral>medial tibial plateau now s/p ORIF 9/9. PMH includes recently hospitalization in [place] for a Lft tibial plateau fracture and discharged to rehab, MGUS, HTN, DM.    PT Comments    Pt supine on arrival, agreeable to therapy session, with good participation and tolerance for session. Pt presents with decreased orientation (to self only) and short term memory deficits, requiring increased time to perform functional mobility tasks. Pt also needing frequent repetition of vc/tc and unable to recall WB restrictions despite teach-back method. Pt performed rolling to L/R sides x2 reps with +35maxA and transition to/from EOB with +79maxA. Continue to recommend SNF level of rehab.  Follow Up Recommendations  SNF     Equipment Recommendations  None recommended by PT    Recommendations for Other Services       Precautions / Restrictions Precautions Precautions: Fall;Other (comment) Required Braces or Orthoses: Knee Immobilizer - Left Knee Immobilizer - Left: On at all times Other Brace: Bledsoe brace LLE locked into knee extension/ prafo Restrictions Weight Bearing Restrictions: Yes LLE Weight Bearing: Non weight bearing Other Position/Activity Restrictions: NWB LLE    Mobility  Bed Mobility Overal bed mobility: Needs Assistance Bed Mobility: Rolling Rolling: Max assist;+2 for physical assistance   Supine to sit: Max assist;+2 for physical assistance;HOB elevated Sit to supine: Max assist;+2 for physical assistance;HOB elevated;Total assist   General bed mobility comments: cues for self-assist with bed rail, pt attempting to pull up on staff member, confusion despite verbal/tactile cues  for self-assist  Transfers    General transfer comment: pt c/o fatigue after sitting EOB ~8 mins, deferred OOB for safety 2/2 confusion  Ambulation/Gait                 Stairs             Wheelchair Mobility    Modified Rankin (Stroke Patients Only)       Balance Overall balance assessment: Needs assistance;History of Falls Sitting-balance support: Feet supported;Bilateral upper extremity supported Sitting balance-Leahy Scale: Poor Sitting balance - Comments: requires 1-2 UE support; reports mild dizziness which quickly resolved. Sp02 96% on 2L [place], HR 105 supine and to 108 bpm seated EOB; Pt with posterior LOB when attempting seated lateral scoot needing minA to correct, but able to perform multiplanar reaching 2-3" outside BOS with BUE and no LOB Postural control: Posterior lean         Cognition Arousal/Alertness: Awake/alert Behavior During Therapy: WFL for tasks assessed/performed Overall Cognitive Status: Impaired/Different from baseline Area of Impairment: Awareness;Problem solving;Safety/judgement;Following commands;Memory;Attention;Orientation                 Orientation Level: Disoriented to;Place;Time;Situation   Memory: Decreased recall of precautions;Decreased short-term memory Following Commands: Follows one step commands inconsistently;Follows one step commands with increased time Safety/Judgement: Decreased awareness of safety;Decreased awareness of deficits   Problem Solving: Slow processing;Decreased initiation;Difficulty sequencing;Requires verbal cues;Requires tactile cues General Comments: good participation as able, poor delayed recall of NWB LLE restrictions, confusion with some 1-step commands during bed mobility      Exercises Other Exercises Other Exercises: Supine BLE AROM: GS, QS, hip abduction, HS (RLE only), SLR (RLE only); Supine BUE AROM: shoulder flexion with deep breaths; 1x10 reps ea  General Comments         Pertinent Vitals/Pain Pain Assessment: Faces Faces Pain Scale: Hurts little more Pain Location: LLE Pain Descriptors / Indicators: Guarding;Grimacing Pain Intervention(s): Limited activity within patient's tolerance;Repositioned;RN gave pain meds during session    Home Living                      Prior Function            PT Goals (current goals can now be found in the care plan section) Acute Rehab PT Goals Potential to Achieve Goals: Fair Progress towards PT goals: Progressing toward goals    Frequency    Min 3X/week      PT Plan Current plan remains appropriate    Co-evaluation     PT goals addressed during session: Mobility/safety with mobility;Balance;Strengthening/ROM        AM-PAC PT "6 Clicks" Mobility   Outcome Measure  Help needed turning from your back to your side while in a flat bed without using bedrails?: Total Help needed moving from lying on your back to sitting on the side of a flat bed without using bedrails?: Total Help needed moving to and from a bed to a chair (including a wheelchair)?: Total Help needed standing up from a chair using your arms (e.g., wheelchair or bedside chair)?: Total Help needed to walk in hospital room?: Total Help needed climbing 3-5 steps with a railing? : Total 6 Click Score: 6    End of Session Equipment Utilized During Treatment: Oxygen;Other (comment) (LLE bledsoe brace locked in extension) Activity Tolerance: Patient tolerated treatment well Patient left: in bed;with call bell/phone within reach;with bed alarm set;Other (comment) (L PRAFO donned and brace intact) Nurse Communication: Mobility status;Need for lift equipment PT Visit Diagnosis: Pain;Muscle weakness (generalized) (M62.81);Repeated falls (R29.6);Dizziness and giddiness (R42) Pain - Right/Left: Left Pain - part of body: Leg     Time: 8828-0034 PT Time Calculation (min) (ACUTE ONLY): 33 min  Charges:  $Therapeutic Exercise: 8-22  mins $Therapeutic Activity: 8-22 mins                     Ceferino Lang P., PTA Acute Rehabilitation Services Pager: [phone] Office: [place] 05/29/2020, 1:19 PM

## 2020-05-30 LAB — URINALYSIS, ROUTINE W REFLEX MICROSCOPIC
Bacteria, UA: NONE SEEN
Bilirubin Urine: NEGATIVE
Glucose, UA: NEGATIVE mg/dL
Hgb urine dipstick: NEGATIVE
Ketones, ur: NEGATIVE mg/dL
Nitrite: NEGATIVE
Protein, ur: NEGATIVE mg/dL
Specific Gravity, Urine: 1.01 (ref 1.005–1.030)
WBC, UA: >50 WBC/hpf — ABNORMAL HIGH (ref 0–5)
pH: 6 (ref 5.0–8.0)

## 2020-05-30 LAB — OSMOLALITY, URINE: Osmolality, Ur: 261 mOsm/kg — ABNORMAL LOW (ref 300–900)

## 2020-05-30 LAB — BASIC METABOLIC PANEL
Anion gap: 9 (ref 5–15)
Anion gap: 11 (ref 5–15)
BUN: 7 mg/dL — ABNORMAL LOW (ref 8–23)
BUN: 15 mg/dL (ref 8–23)
CO2: 25 mmol/L (ref 22–32)
CO2: 26 mmol/L (ref 22–32)
Calcium: 8.3 mg/dL — ABNORMAL LOW (ref 8.9–10.3)
Calcium: 8.8 mg/dL — ABNORMAL LOW (ref 8.9–10.3)
Chloride: 87 mmol/L — ABNORMAL LOW (ref 98–111)
Chloride: 87 mmol/L — ABNORMAL LOW (ref 98–111)
Creatinine, Ser: 0.5 mg/dL (ref 0.44–1.00)
Creatinine, Ser: 0.56 mg/dL (ref 0.44–1.00)
GFR calc Af Amer: >60 mL/min (ref >60)
GFR calc Af Amer: >60 mL/min (ref >60)
GFR calc non Af Amer: >60 mL/min (ref >60)
GFR calc non Af Amer: >60 mL/min (ref >60)
Glucose, Bld: 89 mg/dL (ref 70–99)
Glucose, Bld: 120 mg/dL — ABNORMAL HIGH (ref 70–99)
Potassium: 4 mmol/L (ref 3.5–5.1)
Potassium: 3.8 mmol/L (ref 3.5–5.1)
Sodium: 121 mmol/L — ABNORMAL LOW (ref 135–145)
Sodium: 124 mmol/L — ABNORMAL LOW (ref 135–145)

## 2020-05-30 LAB — SODIUM
Sodium: 121 mmol/L — ABNORMAL LOW (ref 135–145)
Sodium: 122 mmol/L — ABNORMAL LOW (ref 135–145)
Sodium: 122 mmol/L — ABNORMAL LOW (ref 135–145)
Sodium: 124 mmol/L — ABNORMAL LOW (ref 135–145)
Sodium: 123 mmol/L — ABNORMAL LOW (ref 135–145)

## 2020-05-30 LAB — MAGNESIUM: Magnesium: 1.9 mg/dL (ref 1.7–2.4)

## 2020-05-30 LAB — OSMOLALITY: Osmolality: 259 mOsm/kg — ABNORMAL LOW (ref 275–295)

## 2020-05-30 LAB — SODIUM, URINE, RANDOM: Sodium, Ur: <10 mmol/L

## 2020-05-30 MED ORDER — SODIUM CHLORIDE 3 % IV SOLN
INTRAVENOUS | Status: DC
  Administered 2020-05-30: 35 mL/h via INTRAVENOUS
  Filled 2020-05-30: qty 500

## 2020-05-30 MED ORDER — SODIUM CHLORIDE 3 % IV SOLN
INTRAVENOUS | Status: DC
  Filled 2020-05-30 (×2): qty 500

## 2020-05-30 MED ORDER — BISACODYL 10 MG RE SUPP
10.0000 mg | Freq: Once | RECTAL | Status: DC
  Filled 2020-05-30: qty 1

## 2020-05-30 NOTE — Progress Notes (Signed)
PROGRESS NOTE    Katherine Benson  RKY:[id number] DOB: 08-13-1941 DOA: 05/19/2020 PCP: Heber Miller, MD   Brief Narrative: Katherine Benson is a 79 y.o. female with a history of MGUS, Crohn's disease, diabetes mellitus, hypertension.  Patient presented secondary to fall was found to have comminuted tibial plateau fracture in setting of previous tibial fracture.  Orthopedic surgery was consulted and patient underwent ORIF on 9/9.  Hospitalization complicated by hyponatremia.   Assessment & Plan:   Principal Problem:   Fall at home, initial encounter Active Problems:   Unresponsive episode   Hypertension   MGUS (monoclonal gammopathy of unknown significance)   Crohn disease ([place])   Diabetes mellitus type 2 in obese ([place])   Tibial plateau fracture, left, closed, with delayed healing, subsequent encounter   Acute hyponatremia   Pressure injury of skin   Tibial plateau fracture Patient with previously known fracture that was managed non-operatively. On current admission, evidence of comminuted fracture. Orthopedic surgery consulted and patient underwent ORIF on 9/9. 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 Urine sodium/osmolality obtained suggests possible adrenal insufficiency but ACTH stimulation test was negative. No associated hypotension noted but rather hypertension. Patient with a low sodium of 116 for which nephrology started hypertonic saline (3%) @ 20 ml/hr. Sodium improved to 122 this morning around the time hypertonic saline was discontinued -Nephrology recommendations: pending today -Sodium pending this morning  Unresponsiveness Accidental opioid overdose Occurred after Fentanyl during EMS transport. Patient underwent chest compressions and Narcan with resolution.  Confusion Concern there may be underlying  dementia. Likely exacerbated by hyponatremia. Initially improved and has worsened slightly with worsened sodium. Seems improved today.  Essential hypertension -Continue Diltiazem, losartan  MGUS Surveillance currently. Follows with hematology/oncology  Crohn disease Follows with GI as an outpatient. Currently not on medication.  Diabetes mellitus, type 2 Hemoglobin A1C of 6.1%. not on medication as an outpatient  Abdominal distension Likely secondary to constipation. Abdominal ultrasound obtained without etiology. Had a large bowel movement on 9/17 -Continue Colace, MiraLAX, Dulcolax -Repeat Dulcolax suppository  Acute urinary retention Patient required in/out catheterization twice overnight with 1750 mL and 900 mL evacuated. Unsure of etiology. No urinary symptoms per patient. -In/out if unable to urinate; obtain urinalysis/urine culture if in/out required   DVT prophylaxis: Lovenox Code Status:   Code Status: Full Code Family Communication: Son on telephone Disposition Plan: Transfer to progressive unit. Discharge to SNF likely in 3-5 days pending improvement of hyponatremia   Consultants:   Orthopedic surgery  Nephrology  PCCM  Procedures:   LEFT ORIF (05/21/2020)  Antimicrobials:  Vancomycin/Clindamycin (Surgical Prophylaxis)    Subjective: Slightly foggy headed. Otherwise, no issues overnight.  Objective: Vitals:   05/30/20 0640 05/30/20 0641 05/30/20 0700 05/30/20 0720  BP:   115/73   Pulse: 85  87   Resp: 13  19   Temp:    (!) 97.5 F (36.4 C)  TempSrc:      SpO2: (S) (!) 88% 96% 95%   Weight:      Height:        Intake/Output Summary (Last 24 hours) at 05/30/2020 0841 Last data filed at 05/30/2020 0400 Gross per 24 hour  Intake 314.49 ml  Output 2950 ml  Net -2635.51 ml   Filed Weights   05/21/20 1215  Weight: 70.7 kg    Examination:  General exam: Appears calm and comfortable  Respiratory system: Clear to auscultation. Respiratory  effort normal. Cardiovascular system: S1 & S2 heard, RRR. No murmurs, rubs, gallops or clicks. Gastrointestinal system: Abdomen is mild-moderately distended, soft and nontender. No organomegaly or masses felt. Normal bowel sounds heard. Central nervous system: Alert and oriented person and place. No focal neurological deficits. Musculoskeletal: No edema. No calf tenderness Skin: No cyanosis. No rashes Psychiatry: Judgement and insight appear normal. Mood & affect appropriate.     Data Reviewed: I have personally reviewed following labs and imaging studies  CBC Lab Results  Component Value Date   WBC 5.8 05/27/2020   RBC 3.38 (L) 05/27/2020   HGB 10.0 (L) 05/27/2020   HCT 30.8 (L) 05/27/2020   MCV 91.1 05/27/2020   MCH 29.6 05/27/2020   PLT 325 05/27/2020   MCHC 32.5 05/27/2020   RDW 13.4 05/27/2020   LYMPHSABS 0.6 (L) 05/23/2020   MONOABS 0.7 05/23/2020   EOSABS 0.1 05/23/2020   BASOSABS 0.0 25/36/6440     Last metabolic panel Lab Results  Component Value Date   NA 122 (L) 05/30/2020   K 4.0 05/30/2020   CL 87 (L) 05/30/2020   CO2 25 05/30/2020   BUN 7 (L) 05/30/2020   CREATININE 0.50 05/30/2020   GLUCOSE 89 05/30/2020   GFRNONAA >60 05/30/2020   GFRAA >60 05/30/2020   CALCIUM 8.3 (L) 05/30/2020   PHOS 3.5 05/27/2020   ANIONGAP 9 05/30/2020    CBG (last 3)  No results for input(s): GLUCAP in the last 72 hours.   GFR: Estimated Creatinine Clearance: 51.7 mL/min (by C-G formula based on SCr of 0.5 mg/dL).  Coagulation Profile: No results for input(s): INR, PROTIME in the last 168 hours.  Recent Results (from the past 240 hour(s))  Surgical pcr screen     Status: None   Collection Time: 05/20/20  3:11 PM   Specimen: Nasal Mucosa; Nasal Swab  Result Value Ref Range Status   MRSA, PCR NEGATIVE NEGATIVE Final   Staphylococcus aureus NEGATIVE NEGATIVE Final    Comment: (NOTE) The Xpert SA Assay (FDA approved for NASAL specimens in patients 86 years of age  and older), is one component of a comprehensive surveillance program. It is not intended to diagnose infection nor to guide or monitor treatment. Performed at [facility], [place] [address], [place], [place] [address]   SARS Coronavirus 2 by RT PCR (hospital order, performed in [facility] hospital lab) Nasopharyngeal Nasopharyngeal Swab     Status: None   Collection Time: 05/24/20  7:29 AM   Specimen: Nasopharyngeal Swab  Result Value Ref Range Status   SARS Coronavirus 2 NEGATIVE NEGATIVE Final    Comment: (NOTE) SARS-CoV-2 target nucleic acids are NOT DETECTED.  The SARS-CoV-2 RNA is generally detectable in upper and lower respiratory specimens during the acute phase of infection. The lowest concentration of SARS-CoV-2 viral copies this assay can detect is 250 copies / mL. A negative result does not preclude SARS-CoV-2 infection and should not be used as the sole basis for treatment or other patient management decisions.  A negative result may occur with improper specimen collection / handling, submission of specimen other than nasopharyngeal swab, presence of viral mutation(s) within the areas targeted by this assay, and inadequate number of viral copies (<250 copies / mL). A negative result must be combined with clinical observations, patient history, and epidemiological information.  Fact Sheet for Patients:   StrictlyIdeas.no  Fact Sheet for Healthcare Providers: BankingDealers.co.za  This test is not yet approved  or  cleared by the [place] and has been authorized for detection and/or diagnosis of SARS-CoV-2 by FDA under an Emergency Use Authorization (EUA).  This EUA will remain in effect (meaning this test can be used) for the duration of the COVID-19 declaration under Section 564(b)(1) of the Act, 21 U.S.C. section 360bbb-3(b)(1), unless the authorization is terminated or revoked sooner.  Performed at [facility], [place] [address], [place], [place] [address]         Radiology Studies: DG Abd Portable 1V  Result Date: 05/28/2020 CLINICAL DATA:  Abdominal distension EXAM: PORTABLE ABDOMEN - 1 VIEW COMPARISON:  May 19, 2020 FINDINGS: Air and stool-filled nondilated loops of bowel. Mild colonic stool burden predominately within the rectum. Status post total LEFT hip arthroplasty. Degenerative changes of the lumbar spine. Incomplete assessment of the LEFT lateral soft tissues. IMPRESSION: Mild colonic stool burden predominately within the rectum. Nonobstructive bowel gas pattern. Electronically Signed   By: Valentino Saxon MD   On: 05/28/2020 12:31        Scheduled Meds: . acetaminophen  650 mg Oral Q8H  . vitamin C  500 mg Oral Daily  . aspirin  325 mg Oral Daily  . bisacodyl  10 mg Rectal Once  . Chlorhexidine Gluconate Cloth  6 each Topical Daily  . cholecalciferol  2,000 Units Oral BID  . dicyclomine  10 mg Oral BID AC & HS  . diltiazem  180 mg Oral QHS  . docusate sodium  100 mg Oral BID  . enoxaparin (LOVENOX) injection  40 mg Subcutaneous Q24H  . loratadine  10 mg Oral Daily  . losartan  100 mg Oral Daily  . multivitamin  1 tablet Oral Daily  . nutrition supplement (JUVEN)  1 packet Oral BID BM  . pantoprazole  40 mg Oral Daily  . polyethylene glycol  17 g Oral BID  . polyvinyl alcohol  1 drop Both Eyes BID  . triamcinolone  1 spray Nasal BID   Continuous Infusions: . methocarbamol (ROBAXIN) IV    . sodium chloride (hypertonic) Stopped (05/30/20 0241)     LOS: 10 days     Cordelia Poche, MD [facility] 05/30/2020, 8:41 AM  If 7PM-7AM, please contact night-coverage [email or web address]

## 2020-05-30 NOTE — Progress Notes (Signed)
[facility] Progress Note  Name: Katherine Benson MRN: [id number] DOB: Jan 22, 1941   Subjective:  C/o mild nausea and mentally foggy.  Hypertonic off since 2am due to serum Na 122.  I/Os 314 / 2950 yesterday; required I/O cath x 2 for large volume retention  Review of systems:  Limited by AMS; denies shortness of breath and no current n/v ----------- Background on consult:  9F admitted on 9/7 after having a fall and cardiac arrest at the nursing home. Before that she had been admitted to an outside facility for a left tibial plateau fracture related to a fall. She recovered quickly with CPR at the nursing facility, thought to be related to fentanyl administration. She was found to have an unstable fracture with her evaluation here and has subsequently undergone ORIF with orthopedics.  Other past history includes MGUS, IBD/Crohn's, DM 2, hypertension. She is on carbamazepine for trigeminal neuralgia started in 2020.  Patient has a normal serum sodium as recently as 04/02/2020 at a value of 136 and in February of this year was 137.   Intake/Output Summary (Last 24 hours) at 05/30/2020 1049 Last data filed at 05/30/2020 0400 Gross per 24 hour  Intake 314.49 ml  Output 2650 ml  Net -2335.51 ml    Vitals:  Vitals:   05/30/20 0700 05/30/20 0720 05/30/20 0800 05/30/20 0900  BP: 115/73  117/70 (!) 149/81  Pulse: 87  80 81  Resp: 19  16 17   Temp:  (!) 97.5 F (36.4 C)    TempSrc:      SpO2: 95%  97% 97%  Weight:      Height:         Physical Exam:  General elderly female in bed in no acute distress at rest HEENT normocephalic atraumatic  Neck supple trachea midline Lungs clear to auscultation bilaterally; unlabored on 2 liters  Heart tachy; S1S2 no rub  Abdomen soft nontender nondistended  Extremities trace to 1+ edema; left leg immobilized Neuro - awake but disoriented to time, knows hospital and name   Medications reviewed    Labs:  BMP Latest Ref Rng & Units  05/30/2020 05/30/2020 05/30/2020  Glucose 70 - 99 mg/dL - - 89  BUN 8 - 23 mg/dL - - 7(L)  Creatinine 0.44 - 1.00 mg/dL - - 0.50  Sodium 135 - 145 mmol/L 122(L) 122(L) 121(L)  Potassium 3.5 - 5.1 mmol/L - - 4.0  Chloride 98 - 111 mmol/L - - 87(L)  CO2 22 - 32 mmol/L - - 25  Calcium 8.9 - 10.3 mg/dL - - 8.3(L)     Assessment/Plan:   1. SIADH, moderate: Driven by SSRI, AED coupled with pain/nausea as well   - Resume 3% with goal of serum sodium 128 tomorrow AM  - Check sodium every 4 hours for now x 24 hours  - d/w pharmacy, they are placing order  - Please remain off of SSRI  - Please remain off of carbamazepine  - Fluid restrictions in place for 1 liter a day for now, maintain  2.  AMS  - Secondary to hyponatremia - starting 3% as above  3. Recent left tibial plateau fracture status post ORIF  4. IBD/Crohn's - noted.  Monitor for changes in bowel movements   5.  HTN - monitor trends.  Defer additional med adjustments today - BP tolerable currently  6. DM - glucose controlled; per primary team    Justin Mend, MD 05/30/2020 10:49 AM

## 2020-05-31 LAB — BASIC METABOLIC PANEL
Anion gap: 8 (ref 5–15)
BUN: 16 mg/dL (ref 8–23)
CO2: 25 mmol/L (ref 22–32)
Calcium: 8.3 mg/dL — ABNORMAL LOW (ref 8.9–10.3)
Chloride: 96 mmol/L — ABNORMAL LOW (ref 98–111)
Creatinine, Ser: 0.46 mg/dL (ref 0.44–1.00)
GFR calc Af Amer: >60 mL/min (ref >60)
GFR calc non Af Amer: >60 mL/min (ref >60)
Glucose, Bld: 89 mg/dL (ref 70–99)
Potassium: 4.3 mmol/L (ref 3.5–5.1)
Sodium: 129 mmol/L — ABNORMAL LOW (ref 135–145)

## 2020-05-31 LAB — SODIUM
Sodium: 127 mmol/L — ABNORMAL LOW (ref 135–145)
Sodium: 130 mmol/L — ABNORMAL LOW (ref 135–145)

## 2020-05-31 LAB — CBC
HCT: 29.3 % — ABNORMAL LOW (ref 36.0–46.0)
Hemoglobin: 9.7 g/dL — ABNORMAL LOW (ref 12.0–15.0)
MCH: 30.6 pg (ref 26.0–34.0)
MCHC: 33.1 g/dL (ref 30.0–36.0)
MCV: 92.4 fL (ref 80.0–100.0)
Platelets: 407 10*3/uL — ABNORMAL HIGH (ref 150–400)
RBC: 3.17 MIL/uL — ABNORMAL LOW (ref 3.87–5.11)
RDW: 14 % (ref 11.5–15.5)
WBC: 8.5 10*3/uL (ref 4.0–10.5)
nRBC: 0 % (ref 0.0–0.2)

## 2020-05-31 MED ORDER — SODIUM CHLORIDE 3 % IV SOLN
INTRAVENOUS | Status: AC
  Filled 2020-05-31: qty 500

## 2020-05-31 MED ORDER — CIPROFLOXACIN HCL 500 MG PO TABS
250.0000 mg | ORAL_TABLET | Freq: Two times a day (BID) | ORAL | Status: DC
  Administered 2020-05-31 – 2020-06-01 (×3): 250 mg via ORAL
  Filled 2020-05-31 (×5): qty 1

## 2020-05-31 NOTE — Progress Notes (Addendum)
[facility] Progress Note  Name: Katherine Benson MRN: [id number] DOB: [birth year]   Subjective:  Ongoing nausea and now whole body pain due to bed.  No HA, vision issue.   Requiring some I/O cath for urinary retention.   I/O yesterday 1.2 / 1.2 3% off at 6:30 this AM per RN.   Review of systems:  Limited by AMS; denies shortness of breath and no current n/v ----------- Background on consult:  41F admitted on 9/7 after having a fall and cardiac arrest at the nursing home. Before that she had been admitted to an outside facility for a left tibial plateau fracture related to a fall. She recovered quickly with CPR at the nursing facility, thought to be related to fentanyl administration. She was found to have an unstable fracture with her evaluation here and has subsequently undergone ORIF with orthopedics.  Other past history includes MGUS, IBD/Crohn's, DM 2, hypertension. She is on carbamazepine for trigeminal neuralgia started in 2020.  Patient has a normal serum sodium as recently as 04/02/2020 at a value of 136 and in February of this year was 137.   Intake/Output Summary (Last 24 hours) at 05/31/2020 0855 Last data filed at 05/31/2020 0800 Gross per 24 hour  Intake 993.09 ml  Output 1375 ml  Net -381.91 ml    Vitals:  Vitals:   05/31/20 0600 05/31/20 0700 05/31/20 0724 05/31/20 0800  BP: (!) 153/77 (!) 165/83  (!) 150/73  Pulse: 75 77  86  Resp: 15 (!) 23  (!) 27  Temp:   98.4 F (36.9 C)   TempSrc:      SpO2: 97% 98%  97%  Weight:      Height:         Physical Exam:  General elderly female in bed in no acute distress at rest HEENT normocephalic atraumatic  Neck supple trachea midline Lungs clear to auscultation bilaterally; unlabored on 2 liters  Heart tachy; S1S2 no rub  Abdomen soft nontender nondistended  Extremities trace to 1+ edema; left leg immobilized Neuro - awake but disoriented to time, knows hospital and name, same as yesterday   Medications  reviewed    Labs:  BMP Latest Ref Rng & Units 05/31/2020 05/30/2020 05/30/2020  Glucose 70 - 99 mg/dL 89 - -  BUN 8 - 23 mg/dL 16 - -  Creatinine 0.44 - 1.00 mg/dL 0.46 - -  Sodium 135 - 145 mmol/L 129(L) 127(L) 123(L)  Potassium 3.5 - 5.1 mmol/L 4.3 - -  Chloride 98 - 111 mmol/L 96(L) - -  CO2 22 - 32 mmol/L 25 - -  Calcium 8.9 - 10.3 mg/dL 8.3(L) - -     Assessment/Plan:   1. SIADH, moderate: Driven by SSRI, AED coupled with pain/nausea as well   - Has corrected appropriately with 3% saline which has now been discontinued.   - Next serum sodium at noon - if stable ok to move from ICU from my perspective  - Please remain off of SSRI  - Please remain off of carbamazepine if able  - Fluid restrictions in place for 1 liter a day for now, maintain  2.  AMS:  Orientation hasn't appreciably changed with improvement in serum sodium; RN reports some baseline dementia.    3. Recent left tibial plateau fracture status post ORIF  4. IBD/Crohn's - noted.  Monitor for changes in bowel movements   5.  HTN - monitor trends.  Defer additional med adjustments today - BP tolerable currently  6. DM - glucose controlled; per primary team   7.  Urinary retention:  Cont PVRs and I/O cath as needed.   Justin Mend, MD 05/31/2020 8:55 AM  ADDENDUM:  Katherine Benson sodium 130 - ok to remain off 3% and from my perspective ok to transfer out of ICU

## 2020-05-31 NOTE — Progress Notes (Signed)
PROGRESS NOTE    Katherine Benson  XIP:[id number] DOB: [birth year] DOA: 05/19/2020 PCP: Heber Megargel, MD   Brief Narrative: Nettie Wyffels is a 79 y.o. female with a history of MGUS, Crohn's disease, diabetes mellitus, hypertension.  Patient presented secondary to fall was found to have comminuted tibial plateau fracture in setting of previous tibial fracture.  Orthopedic surgery was consulted and patient underwent ORIF on 9/9.  Hospitalization complicated by hyponatremia.   Assessment & Plan:   Principal Problem:   Fall at home, initial encounter Active Problems:   Unresponsive episode   Hypertension   MGUS (monoclonal gammopathy of unknown significance)   Crohn disease ([place])   Diabetes mellitus type 2 in obese ([place])   Tibial plateau fracture, left, closed, with delayed healing, subsequent encounter   Acute hyponatremia   Pressure injury of skin   Tibial plateau fracture Patient with previously known fracture that was managed non-operatively. On current admission, evidence of comminuted fracture. Orthopedic surgery consulted and patient underwent ORIF on 9/9. 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 Urine sodium/osmolality obtained suggests possible adrenal insufficiency but ACTH stimulation test was negative. No associated hypotension noted but rather hypertension. Patient with a low sodium of 116 for which nephrology started hypertonic saline (3%) @ 20 ml/hr. Sodium improved to 129 this morning -Nephrology recommendations: Repeat sodium this afternoon; 3% sodium held  Unresponsiveness Accidental opioid overdose Occurred after Fentanyl during EMS transport. Patient underwent chest compressions and Narcan with resolution.  Confusion Concern there may be underlying dementia. Likely exacerbated by hyponatremia. Initially  improved and has worsened slightly with worsened sodium. Seems baseline.  Essential hypertension -Continue Diltiazem, losartan  MGUS Surveillance currently. Follows with hematology/oncology  Crohn disease Follows with GI as an outpatient. Currently not on medication.  Diabetes mellitus, type 2 Hemoglobin A1C of 6.1%. not on medication as an outpatient  Abdominal distension Likely secondary to constipation. Abdominal ultrasound obtained without etiology. Had a large bowel movement on 9/17 -Continue Colace, MiraLAX, Dulcolax -Repeat Dulcolax suppository  Acute urinary retention Patient with persistent retention. Foley catheter placed on 9/18. Urinalysis unremarkable except for pyuria. Urine culture obtained and is pending. No medications on list I would expect to cause this. -Follow-up urine culture -Voiding trial in 1-2 days  Anxiety Patient appears to have significant anxiety. She states she was being managed for this in the past but is currently not on any treatment. She states she is scared but cannot tell me specifically why she is scared. May benefit from therapy.   DVT prophylaxis: Lovenox Code Status:   Code Status: Full Code Family Communication: Son on telephone, no response Disposition Plan: Transfer out of ICU today pending sodium labs this afternoon. Discharge to SNF likely in 2-4 days pending improvement of hyponatremia   Consultants:   Orthopedic surgery  Nephrology  PCCM  Procedures:   LEFT ORIF (05/21/2020)  Antimicrobials:  Vancomycin/Clindamycin (Surgical Prophylaxis)    Subjective: Slightly foggy headed. Otherwise, no issues overnight.  Objective: Vitals:   05/31/20 0600 05/31/20 0700 05/31/20 0724 05/31/20 0800  BP: (!) 153/77 (!) 165/83  (!) 150/73  Pulse: 75 77  86  Resp: 15 (!) 23  (!) 27  Temp:   98.4 F (36.9 C)   TempSrc:      SpO2: 97% 98%  97%  Weight:      Height:  Intake/Output Summary (Last 24 hours) at 05/31/2020  0915 Last data filed at 05/31/2020 0800 Gross per 24 hour  Intake 993.09 ml  Output 1375 ml  Net -381.91 ml   Filed Weights   05/21/20 1215  Weight: 70.7 kg    Examination:  General exam: Appears comfortable Respiratory system: Clear to auscultation. Respiratory effort normal. Cardiovascular system: S1 & S2 heard, RRR. 2/6 systolic murmur Gastrointestinal system: Abdomen is mildly distended, soft and generally tender. No organomegaly or masses felt. Normal bowel sounds heard. Central nervous system: Alert and oriented to person and place. No focal neurological deficits. Musculoskeletal: No edema. No calf tenderness Skin: No cyanosis. No rashes Psychiatry: Judgement and insight appear impaired. Anxious.     Data Reviewed: I have personally reviewed following labs and imaging studies  CBC Lab Results  Component Value Date   WBC 5.8 05/27/2020   RBC 3.38 (L) 05/27/2020   HGB 10.0 (L) 05/27/2020   HCT 30.8 (L) 05/27/2020   MCV 91.1 05/27/2020   MCH 29.6 05/27/2020   PLT 325 05/27/2020   MCHC 32.5 05/27/2020   RDW 13.4 05/27/2020   LYMPHSABS 0.6 (L) 05/23/2020   MONOABS 0.7 05/23/2020   EOSABS 0.1 05/23/2020   BASOSABS 0.0 03/05/7627     Last metabolic panel Lab Results  Component Value Date   NA 129 (L) 05/31/2020   K 4.3 05/31/2020   CL 96 (L) 05/31/2020   CO2 25 05/31/2020   BUN 16 05/31/2020   CREATININE 0.46 05/31/2020   GLUCOSE 89 05/31/2020   GFRNONAA >60 05/31/2020   GFRAA >60 05/31/2020   CALCIUM 8.3 (L) 05/31/2020   PHOS 3.5 05/27/2020   ANIONGAP 8 05/31/2020    CBG (last 3)  No results for input(s): GLUCAP in the last 72 hours.   GFR: Estimated Creatinine Clearance: 51.7 mL/min (by C-G formula based on SCr of 0.46 mg/dL).  Coagulation Profile: No results for input(s): INR, PROTIME in the last 168 hours.  Recent Results (from the past 240 hour(s))  SARS Coronavirus 2 by RT PCR (hospital order, performed in [facility] hospital lab)  Nasopharyngeal Nasopharyngeal Swab     Status: None   Collection Time: 05/24/20  7:29 AM   Specimen: Nasopharyngeal Swab  Result Value Ref Range Status   SARS Coronavirus 2 NEGATIVE NEGATIVE Final    Comment: (NOTE) SARS-CoV-2 target nucleic acids are NOT DETECTED.  The SARS-CoV-2 RNA is generally detectable in upper and lower respiratory specimens during the acute phase of infection. The lowest concentration of SARS-CoV-2 viral copies this assay can detect is 250 copies / mL. A negative result does not preclude SARS-CoV-2 infection and should not be used as the sole basis for treatment or other patient management decisions.  A negative result may occur with improper specimen collection / handling, submission of specimen other than nasopharyngeal swab, presence of viral mutation(s) within the areas targeted by this assay, and inadequate number of viral copies (<250 copies / mL). A negative result must be combined with clinical observations, patient history, and epidemiological information.  Fact Sheet for Patients:   StrictlyIdeas.no  Fact Sheet for Healthcare Providers: BankingDealers.co.za  This test is not yet approved or  cleared by the [place] FDA and has been authorized for detection and/or diagnosis of SARS-CoV-2 by FDA under an Emergency Use Authorization (EUA).  This EUA will remain in effect (meaning this test can be used) for the duration of the COVID-19 declaration under Section 564(b)(1) of the Act, 21 U.S.C. section 360bbb-3(b)(1),  unless the authorization is terminated or revoked sooner.  Performed at [facility], [place] [address], [place], [place] [address]   Culture, Urine     Status: Abnormal (Preliminary result)   Collection Time: 05/30/20  3:04 PM   Specimen: Urine, Clean Catch  Result Value Ref Range Status   Specimen Description URINE, CLEAN CATCH  Final   Special Requests NONE  Final   Culture  (A)  Final    >=100,000 COLONIES/mL GRAM NEGATIVE RODS CULTURE REINCUBATED FOR BETTER GROWTH SUSCEPTIBILITIES TO FOLLOW Performed at [facility], [address], [place], [place] [address]    Report Status PENDING  Incomplete        Radiology Studies: No results found.      Scheduled Meds: . acetaminophen  650 mg Oral Q8H  . vitamin C  500 mg Oral Daily  . aspirin  325 mg Oral Daily  . bisacodyl  10 mg Rectal Once  . Chlorhexidine Gluconate Cloth  6 each Topical Daily  . cholecalciferol  2,000 Units Oral BID  . dicyclomine  10 mg Oral BID AC & HS  . diltiazem  180 mg Oral QHS  . docusate sodium  100 mg Oral BID  . enoxaparin (LOVENOX) injection  40 mg Subcutaneous Q24H  . loratadine  10 mg Oral Daily  . losartan  100 mg Oral Daily  . multivitamin  1 tablet Oral Daily  . nutrition supplement (JUVEN)  1 packet Oral BID BM  . pantoprazole  40 mg Oral Daily  . polyethylene glycol  17 g Oral BID  . polyvinyl alcohol  1 drop Both Eyes BID  . triamcinolone  1 spray Nasal BID   Continuous Infusions: . methocarbamol (ROBAXIN) IV    . sodium chloride (hypertonic) Stopped (05/31/20 1448)     LOS: 11 days     Cordelia Poche, MD [facility] 05/31/2020, 9:15 AM  If 7PM-7AM, please contact night-coverage [email or web address]

## 2020-06-01 ENCOUNTER — Inpatient Hospital Stay (HOSPITAL_COMMUNITY): Payer: Medicare HMO

## 2020-06-01 LAB — GLUCOSE, CAPILLARY
Glucose-Capillary: 112 mg/dL — ABNORMAL HIGH (ref 70–99)
Glucose-Capillary: 102 mg/dL — ABNORMAL HIGH (ref 70–99)

## 2020-06-01 LAB — OSMOLALITY, URINE: Osmolality, Ur: 216 mOsm/kg — ABNORMAL LOW (ref 300–900)

## 2020-06-01 LAB — BASIC METABOLIC PANEL
Anion gap: 8 (ref 5–15)
BUN: 12 mg/dL (ref 8–23)
CO2: 25 mmol/L (ref 22–32)
Calcium: 8.5 mg/dL — ABNORMAL LOW (ref 8.9–10.3)
Chloride: 91 mmol/L — ABNORMAL LOW (ref 98–111)
Creatinine, Ser: 0.46 mg/dL (ref 0.44–1.00)
GFR calc Af Amer: >60 mL/min (ref >60)
GFR calc non Af Amer: >60 mL/min (ref >60)
Glucose, Bld: 100 mg/dL — ABNORMAL HIGH (ref 70–99)
Potassium: 3.8 mmol/L (ref 3.5–5.1)
Sodium: 124 mmol/L — ABNORMAL LOW (ref 135–145)

## 2020-06-01 LAB — SODIUM, URINE, RANDOM: Sodium, Ur: 47 mmol/L

## 2020-06-01 LAB — OSMOLALITY: Osmolality: 270 mOsm/kg — ABNORMAL LOW (ref 275–295)

## 2020-06-01 LAB — CREATININE, URINE, RANDOM: Creatinine, Urine: 13.49 mg/dL

## 2020-06-01 LAB — SODIUM: Sodium: 128 mmol/L — ABNORMAL LOW (ref 135–145)

## 2020-06-01 MED ORDER — FOSFOMYCIN TROMETHAMINE 3 G PO PACK
3.0000 g | PACK | Freq: Once | ORAL | Status: AC
  Administered 2020-06-01: 3 g via ORAL
  Filled 2020-06-01: qty 3

## 2020-06-01 NOTE — Progress Notes (Signed)
PROGRESS NOTE    Katherine Benson  WNI:[id number] DOB: 01-01-1941 DOA: 05/19/2020 PCP: Heber [place], MD   Brief Narrative: Katherine Benson is a 79 y.o. female with a history of MGUS, Crohn's disease, diabetes mellitus, hypertension.  Patient presented secondary to fall was found to have comminuted tibial plateau fracture in setting of previous tibial fracture.  Orthopedic surgery was consulted and patient underwent ORIF on 9/9.  Hospitalization complicated by hyponatremia.   Assessment & Plan:   Principal Problem:   Fall at home, initial encounter Active Problems:   Unresponsive episode   Hypertension   MGUS (monoclonal gammopathy of unknown significance)   Crohn disease ([place])   Diabetes mellitus type 2 in obese ([place])   Tibial plateau fracture, left, closed, with delayed healing, subsequent encounter   Acute hyponatremia   Pressure injury of skin   Tibial plateau fracture Patient with previously known fracture that was managed non-operatively. On current admission, evidence of comminuted fracture. Orthopedic surgery consulted and patient underwent ORIF on 9/9. 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 Urine sodium/osmolality obtained suggests possible adrenal insufficiency but ACTH stimulation test was negative. No associated hypotension noted but rather hypertension. Patient with a low sodium of 116 for which nephrology started hypertonic saline (3%) @ 20 ml/hr with improvement. Sodium improved to 130 off of hypertonic saline with worsening back down to 124 this morning.  -Nephrology recommendations: pending today  Unresponsiveness Accidental opioid overdose Occurred after Fentanyl during EMS transport. Patient underwent chest compressions and Narcan with resolution.  Confusion Concern there may be underlying dementia.  Likely exacerbated by hyponatremia. Initially improved and has worsened slightly with worsened sodium. Seems baseline.  Essential hypertension -Continue Diltiazem, losartan  MGUS Surveillance currently. Follows with hematology/oncology  Crohn disease Follows with GI as an outpatient. Currently not on medication.  Diabetes mellitus, type 2 Hemoglobin A1C of 6.1%. not on medication as an outpatient  Abdominal distension Likely secondary to constipation. Abdominal ultrasound obtained without etiology. Had a large bowel movement on 9/17. Still with mild distention and pain. -Continue Colace, MiraLAX, Dulcolax -Abdominal x-ray  Acute urinary retention Patient with persistent retention. Foley catheter placed on 9/18. Urinalysis unremarkable except for pyuria. Urine culture obtained and is pending. No medications on list I would expect to cause this. Urine culture significant for E. Coli bacteruria. Ciprofloxacin started 9/19 secondary to penicillin allergy -Voiding trial in 2 days -Continue Ciprofloxacin 250 mg PO x3 days  Anxiety Patient appears to have significant anxiety. She states she was being managed for this in the past but is currently not on any treatment. She states she is scared but cannot tell me specifically why she is scared. May benefit from therapy.   DVT prophylaxis: Lovenox Code Status:   Code Status: Full Code Family Communication: Son on telephone Disposition Plan:  Discharge to SNF likely in 2-4 days pending improvement of hyponatremia   Consultants:   Orthopedic surgery  Nephrology  PCCM  Procedures:   LEFT ORIF (05/21/2020)  Antimicrobials:  Vancomycin/Clindamycin (Surgical Prophylaxis)    Subjective: Feeling okay. Mentation feels baseline.  Objective: Vitals:   05/31/20 2032 05/31/20 2349 06/01/20 0308 06/01/20 0737  BP: (!) 170/94 (!) 161/87 (!) 156/84 (!) 147/87  Pulse: 84 81 80 77  Resp: 20 18 18 20   Temp: 98.1 F (36.7 C) 98.2 F (36.8  C) 98.4 F (36.9 C) 98.5 F (36.9 C)  TempSrc: Oral Oral Oral Oral  SpO2: 97% 96% 93% 96%  Weight:      Height:        Intake/Output Summary (Last 24 hours) at 06/01/2020 1043 Last data filed at 06/01/2020 0175 Gross per 24 hour  Intake --  Output 2265 ml  Net -2265 ml   Filed Weights   05/21/20 1215  Weight: 70.7 kg    Examination:  General exam: Appears calm and comfortable Respiratory system: Clear to auscultation. Respiratory effort normal. Cardiovascular system: S1 & S2 heard, RRR. No murmurs, rubs, gallops or clicks. Gastrointestinal system: Abdomen is mildly distended, soft and mildly tender. No organomegaly or masses felt. Normal bowel sounds heard. Central nervous system: Alert and oriented. No focal neurological deficits. Musculoskeletal: No calf tenderness Skin: No cyanosis. No rashes Psychiatry: Judgement and insight appear normal. Mood & affect appropriate.     Data Reviewed: I have personally reviewed following labs and imaging studies  CBC Lab Results  Component Value Date   WBC 8.5 05/31/2020   RBC 3.17 (L) 05/31/2020   HGB 9.7 (L) 05/31/2020   HCT 29.3 (L) 05/31/2020   MCV 92.4 05/31/2020   MCH 30.6 05/31/2020   PLT 407 (H) 05/31/2020   MCHC 33.1 05/31/2020   RDW 14.0 05/31/2020   LYMPHSABS 0.6 (L) 05/23/2020   MONOABS 0.7 05/23/2020   EOSABS 0.1 05/23/2020   BASOSABS 0.0 07/06/8526     Last metabolic panel Lab Results  Component Value Date   NA 124 (L) 06/01/2020   K 3.8 06/01/2020   CL 91 (L) 06/01/2020   CO2 25 06/01/2020   BUN 12 06/01/2020   CREATININE 0.46 06/01/2020   GLUCOSE 100 (H) 06/01/2020   GFRNONAA >60 06/01/2020   GFRAA >60 06/01/2020   CALCIUM 8.5 (L) 06/01/2020   PHOS 3.5 05/27/2020   ANIONGAP 8 06/01/2020    CBG (last 3)  No results for input(s): GLUCAP in the last 72 hours.   GFR: Estimated Creatinine Clearance: 51.7 mL/min (by C-G formula based on SCr of 0.46 mg/dL).  Coagulation Profile: No results for  input(s): INR, PROTIME in the last 168 hours.  Recent Results (from the past 240 hour(s))  SARS Coronavirus 2 by RT PCR (hospital order, performed in [facility] hospital lab) Nasopharyngeal Nasopharyngeal Swab     Status: None   Collection Time: 05/24/20  7:29 AM   Specimen: Nasopharyngeal Swab  Result Value Ref Range Status   SARS Coronavirus 2 NEGATIVE NEGATIVE Final    Comment: (NOTE) SARS-CoV-2 target nucleic acids are NOT DETECTED.  The SARS-CoV-2 RNA is generally detectable in upper and lower respiratory specimens during the acute phase of infection. The lowest concentration of SARS-CoV-2 viral copies this assay can detect is 250 copies / mL. A negative result does not preclude SARS-CoV-2 infection and should not be used as the sole basis for treatment or other patient management decisions.  A negative result may occur with improper specimen collection / handling, submission of specimen other than nasopharyngeal swab, presence of viral mutation(s) within the areas targeted by this assay, and inadequate number of viral copies (<250 copies / mL). A negative result must be combined with clinical observations, patient history, and epidemiological information.  Fact Sheet for Patients:   StrictlyIdeas.no  Fact Sheet for Healthcare Providers: BankingDealers.co.za  This test is not yet approved or  cleared by the [place] FDA and has been authorized for detection and/or diagnosis of SARS-CoV-2 by FDA under an Emergency Use Authorization (EUA).  This  EUA will remain in effect (meaning this test can be used) for the duration of the COVID-19 declaration under Section 564(b)(1) of the Act, 21 U.S.C. section 360bbb-3(b)(1), unless the authorization is terminated or revoked sooner.  Performed at [facility], [place] [address], [place], [place] [address]   Culture, Urine     Status: Abnormal (Preliminary result)   Collection  Time: 05/30/20  3:04 PM   Specimen: Urine, Clean Catch  Result Value Ref Range Status   Specimen Description URINE, CLEAN CATCH  Final   Special Requests NONE  Final   Culture (A)  Final    >=100,000 COLONIES/mL ESCHERICHIA COLI CULTURE REINCUBATED FOR BETTER GROWTH SUSCEPTIBILITIES TO FOLLOW Performed at [facility], [address], [place], [place] [address]    Report Status PENDING  Incomplete        Radiology Studies: No results found.      Scheduled Meds: . acetaminophen  650 mg Oral Q8H  . vitamin C  500 mg Oral Daily  . aspirin  325 mg Oral Daily  . bisacodyl  10 mg Rectal Once  . Chlorhexidine Gluconate Cloth  6 each Topical Daily  . cholecalciferol  2,000 Units Oral BID  . ciprofloxacin  250 mg Oral BID  . dicyclomine  10 mg Oral BID AC & HS  . diltiazem  180 mg Oral QHS  . docusate sodium  100 mg Oral BID  . enoxaparin (LOVENOX) injection  40 mg Subcutaneous Q24H  . loratadine  10 mg Oral Daily  . losartan  100 mg Oral Daily  . multivitamin  1 tablet Oral Daily  . nutrition supplement (JUVEN)  1 packet Oral BID BM  . pantoprazole  40 mg Oral Daily  . polyethylene glycol  17 g Oral BID  . polyvinyl alcohol  1 drop Both Eyes BID  . triamcinolone  1 spray Nasal BID   Continuous Infusions: . methocarbamol (ROBAXIN) IV       LOS: 12 days     Cordelia Poche, MD [facility] 06/01/2020, 10:43 AM  If 7PM-7AM, please contact night-coverage [email or web address]

## 2020-06-01 NOTE — Care Management Important Message (Signed)
Important Message  Patient Details  Name: Katherine Benson MRN: [id number] Date of Birth: [birth year]   Medicare Important Message Given:  Yes     Shelda Altes 06/01/2020, 11:34 AM

## 2020-06-01 NOTE — Progress Notes (Signed)
Patient ID: Katherine Benson, female   DOB: Apr 06, 1941, 79 y.o.   MRN: [id number] S: Reports some nausea this morning but no vomiting O:BP (!) 147/87 (BP Location: Right Arm)   Pulse 77   Temp 98.5 F (36.9 C) (Oral)   Resp 20   Ht 5' (1.524 m)   Wt 70.7 kg   SpO2 96%   BMI 30.44 kg/m   Intake/Output Summary (Last 24 hours) at 06/01/2020 1440 Last data filed at 06/01/2020 1400 Gross per 24 hour  Intake 240 ml  Output 3465 ml  Net -3225 ml   Intake/Output: I/O last 3 completed shifts: In: 438.1 [P.O.:240; I.V.:198.1] Out: 3125 [Urine:3125]  Intake/Output this shift:  Total I/O In: 240 [P.O.:240] Out: 1200 [Urine:1200] Weight change:  KVQ:QVZDG elderly WF in NAD CVS:RRR no rub Resp: cta Abd: benign Ext: trace edema of LLE and stiches c/d/i, no edema or RLE  Recent Labs  Lab 05/27/20 0356 05/27/20 1818 05/28/20 0647 05/28/20 1448 05/29/20 0403 05/29/20 0739 05/30/20 0326 05/30/20 0540 05/30/20 1544 05/30/20 1936 05/30/20 2332 05/31/20 0449 05/31/20 1137 06/01/20 0204 06/01/20 1051  NA 127*   < > 123*   < > 116*   < > 121*   < > 124* 123* 127* 129* 130* 124* 128*  K 3.4*  --  3.5  --  3.6  --  4.0  --  3.8  --   --  4.3  --  3.8  --   CL 91*  --  83*  --  82*  --  87*  --  87*  --   --  96*  --  91*  --   CO2 28  --  28  --  27  --  25  --  26  --   --  25  --  25  --   GLUCOSE 98  --  100*  --  108*  --  89  --  120*  --   --  89  --  100*  --   BUN 5*  --  7*  --  7*  --  7*  --  15  --   --  16  --  12  --   CREATININE 0.52  --  0.54  --  0.51  --  0.50  --  0.56  --   --  0.46  --  0.46  --   CALCIUM 8.7*  --  8.7*  --  8.5*  --  8.3*  --  8.8*  --   --  8.3*  --  8.5*  --   PHOS 3.5  --   --   --   --   --   --   --   --   --   --   --   --   --   --    < > = values in this interval not displayed.   Liver Function Tests: No results for input(s): AST, ALT, ALKPHOS, BILITOT, PROT, ALBUMIN in the last 168 hours. No results for input(s): LIPASE, AMYLASE in the  last 168 hours. No results for input(s): AMMONIA in the last 168 hours. CBC: Recent Labs  Lab 05/26/20 0506 05/27/20 0356 05/31/20 1137  WBC  --  5.8 8.5  HGB 8.9* 10.0* 9.7*  HCT 27.4* 30.8* 29.3*  MCV  --  91.1 92.4  PLT  --  325 407*   Cardiac Enzymes: No results for input(s): CKTOTAL, CKMB, CKMBINDEX,  TROPONINI in the last 168 hours. CBG: Recent Labs  Lab 06/01/20 1121  GLUCAP 112*    Iron Studies: No results for input(s): IRON, TIBC, TRANSFERRIN, FERRITIN in the last 72 hours. Studies/Results: DG Abd 2 Views  Result Date: 06/01/2020 CLINICAL DATA:  Abdominal pain EXAM: X-RAY ABDOMEN 2 VIEWS COMPARISON:  May 28, 2020 FINDINGS: Supine and upright images were obtained. There is no bowel dilatation or air-fluid level to suggest bowel obstruction. No free air. Tiny calcifications in the pelvis likely represent phleboliths. There is a total hip replacement on the left. There is airspace opacity in each lower lung region consistent with multifocal pneumonia. IMPRESSION: No bowel obstruction or free air evident. Multifocal pneumonia in the lung bases. Status post left total hip replacement. Electronically Signed   By: Lowella Grip III M.D.   On: 06/01/2020 12:05   . acetaminophen  650 mg Oral Q8H  . vitamin C  500 mg Oral Daily  . aspirin  325 mg Oral Daily  . bisacodyl  10 mg Rectal Once  . Chlorhexidine Gluconate Cloth  6 each Topical Daily  . cholecalciferol  2,000 Units Oral BID  . dicyclomine  10 mg Oral BID AC & HS  . diltiazem  180 mg Oral QHS  . docusate sodium  100 mg Oral BID  . enoxaparin (LOVENOX) injection  40 mg Subcutaneous Q24H  . loratadine  10 mg Oral Daily  . losartan  100 mg Oral Daily  . multivitamin  1 tablet Oral Daily  . nutrition supplement (JUVEN)  1 packet Oral BID BM  . pantoprazole  40 mg Oral Daily  . polyethylene glycol  17 g Oral BID  . polyvinyl alcohol  1 drop Both Eyes BID  . triamcinolone  1 spray Nasal BID    BMET     Component Value Date/Time   NA 128 (L) 06/01/2020 1051   K 3.8 06/01/2020 0204   CL 91 (L) 06/01/2020 0204   CO2 25 06/01/2020 0204   GLUCOSE 100 (H) 06/01/2020 0204   BUN 12 06/01/2020 0204   CREATININE 0.46 06/01/2020 0204   CALCIUM 8.5 (L) 06/01/2020 0204   GFRNONAA >60 06/01/2020 0204   GFRAA >60 06/01/2020 0204   CBC    Component Value Date/Time   WBC 8.5 05/31/2020 1137   RBC 3.17 (L) 05/31/2020 1137   HGB 9.7 (L) 05/31/2020 1137   HCT 29.3 (L) 05/31/2020 1137   PLT 407 (H) 05/31/2020 1137   MCV 92.4 05/31/2020 1137   MCH 30.6 05/31/2020 1137   MCHC 33.1 05/31/2020 1137   RDW 14.0 05/31/2020 1137   LYMPHSABS 0.6 (L) 05/23/2020 0411   MONOABS 0.7 05/23/2020 0411   EOSABS 0.1 05/23/2020 0411   BASOSABS 0.0 05/23/2020 0411     Assessment/Plan:  1. 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  Sodium rose to 130 but then dropped to 124 this morning only to increase to 128 this afternoon.   1. Continue to hold SSRI and fluid restrict to 1 liter/day.   2. She is asymptomatic so will continue to follow sodium levels.   3. No need for hypertonic saline. 4. Will recheck urine osm, serum osm, and urine sodium now that she is off of meds and IVF's. 2. 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 3. Left tibial plateau fracture s/p ORIF 4. ABLA- following surgery.  Follow H/H and transfuse prn.  5. IBD/Crohns 6. HTN- stable 7. DM  type 2 per primary 8. Urinary retention- follow PVR's and I/O cath prn.   [name], MD [employer] [phone]

## 2020-06-02 ENCOUNTER — Inpatient Hospital Stay (HOSPITAL_COMMUNITY): Payer: Medicare HMO

## 2020-06-02 LAB — BASIC METABOLIC PANEL
Anion gap: 9 (ref 5–15)
BUN: 17 mg/dL (ref 8–23)
CO2: 27 mmol/L (ref 22–32)
Calcium: 8.9 mg/dL (ref 8.9–10.3)
Chloride: 93 mmol/L — ABNORMAL LOW (ref 98–111)
Creatinine, Ser: 0.52 mg/dL (ref 0.44–1.00)
GFR calc Af Amer: >60 mL/min (ref >60)
GFR calc non Af Amer: >60 mL/min (ref >60)
Glucose, Bld: 97 mg/dL (ref 70–99)
Potassium: 3.8 mmol/L (ref 3.5–5.1)
Sodium: 129 mmol/L — ABNORMAL LOW (ref 135–145)

## 2020-06-02 LAB — URINE CULTURE: Culture: >=100000 — AB

## 2020-06-02 NOTE — Progress Notes (Signed)
Occupational Therapy Treatment Patient Details Name: Katherine Benson MRN: [id number] DOB: 1941/09/02 Today's Date: 06/02/2020    History of present illness Patient is a 79 y/o female who presents after fall at rehab, + CPR after given opiod and s/p narcan. CT-comminuted fx of left proximal tibia, lateral>medial tibial plateau now s/p ORIF 9/9. PMH includes recently hospitalization in [place] for a Lft tibial plateau fracture and discharged to rehab, MGUS, HTN, DM.   OT comments  Patient encouraged to sit EOB this date.  PT/OT co-treat.  Patient able to sit EOB for approximately 10 min with varying level of support for sitting balance, and one person to support her L lower leg.  Initially she required cues to lean forward and Mod A to sit EOB.  After a few minutes, the patient was close supervision.  Patient declined lateral scoot to recliner, but was able to scoot closer to [facility] with Mod A of 2.  Patient managed to wash and dry her hands and face with setup, min guard and encouragement.  Discharge plan to SNF remains appropriate.  OT will continue to see her in the acute setting to maximize her functional status.     Follow Up Recommendations  SNF    Equipment Recommendations  3 in 1 bedside commode;Tub/shower bench;Wheelchair cushion (measurements OT);Wheelchair (measurements OT);Hospital bed    Recommendations for Other Services      Precautions / Restrictions Precautions Precautions: Fall Precaution Comments: O2 sats Required Braces or Orthoses: Knee Immobilizer - Left Knee Immobilizer - Left: On at all times Other Brace: Bledsoe brace LLE locked into knee extension/ prafo Restrictions Weight Bearing Restrictions: Yes RLE Weight Bearing: Weight bearing as tolerated LLE Weight Bearing: Non weight bearing       Mobility Bed Mobility Overal bed mobility: Needs Assistance Bed Mobility: Rolling;Supine to Sit;Sit to Supine Rolling: Max assist;+2 for physical assistance   Supine  to sit: Mod assist;+2 for physical assistance Sit to supine: Mod assist;+2 for physical assistance   General bed mobility comments: Pt anxious about moving, but helped out the more she accomplished.  Pt needed consistent cuing for encouragement to try the next task.  Transfers Overall transfer level: Needs assistance   Transfers: Lateral/Scoot Transfers          Lateral/Scoot Transfers: Mod assist;+2 physical assistance (with use of the pad to assist) General transfer comment: pt declined OOB, but worked on coming forward and use of bil UE's to [employer] her buttocks and scoot to the Left toward [place] Overall balance assessment: Needs assistance Sitting-balance support: Feet supported;Bilateral upper extremity supported Sitting balance-Leahy Scale: Poor Sitting balance - Comments: reliant on 1 or even better 2 UE's to sit without extern assist, but spent approx 10 min  EOB.  Postural control: Posterior lean                                 ADL either performed or assessed with clinical judgement   ADL Overall ADL's : Needs assistance/impaired     Grooming: Minimal assistance;Bed level                               Functional mobility during ADLs: +2 for physical assistance;+2 for safety/equipment;Moderate assistance       Vision Baseline Vision/History: Wears glasses Wears Glasses: At all times Patient Visual Report: No change from baseline  Perception     Praxis      Cognition Arousal/Alertness: Awake/alert Behavior During Therapy: WFL for tasks assessed/performed Overall Cognitive Status: Impaired/Different from baseline                   Orientation Level: Situation;Time Current Attention Level: Selective Memory: Decreased recall of precautions;Decreased short-term memory Following Commands: Follows one step commands with increased time;Follows one step commands inconsistently Safety/Judgement: Decreased awareness  of deficits Awareness: Intellectual Problem Solving: Slow processing;Decreased initiation;Requires verbal cues          Exercises   Shoulder Instructions       General Comments sats on RA (unintentionally disconnected) maintained at 91%.  With mobility, sats dropped into the upper 80's with quick return to the lower to mid 90's on .5 L    Pertinent Vitals/ Pain       Pain Assessment: Faces Faces Pain Scale: Hurts little more Pain Location: LLE Pain Descriptors / Indicators: Grimacing;Guarding Pain Intervention(s): Monitored during session;Repositioned                                                          Frequency  Min 2X/week        Progress Toward Goals  OT Goals(current goals can now be found in the care plan section)  Progress towards OT goals: Progressing toward goals  Acute Rehab OT Goals Patient Stated Goal: Improve EOB sitting for lateral scoot transfer.  Begin UB ADL seated EOB. OT Goal Formulation: With patient Time For Goal Achievement: 06/05/20 Potential to Achieve Goals: [place] Discharge plan remains appropriate    Co-evaluation    PT/OT/SLP Co-Evaluation/Treatment: Yes Reason for Co-Treatment: Complexity of the patient's impairments (multi-system involvement);Necessary to address cognition/behavior during functional activity;For patient/therapist safety;To address functional/ADL transfers PT goals addressed during session: Mobility/safety with mobility OT goals addressed during session: ADL's and self-care      AM-PAC OT "6 Clicks" Daily Activity     Outcome Measure   Help from another person eating meals?: A Little Help from another person taking care of personal grooming?: A Little Help from another person toileting, which includes using toliet, bedpan, or urinal?: Total Help from another person bathing (including washing, rinsing, drying)?: A Lot Help from another person to put on and taking off regular upper  body clothing?: A Lot Help from another person to put on and taking off regular lower body clothing?: Total 6 Click Score: 12    End of Session Equipment Utilized During Treatment: Oxygen  OT Visit Diagnosis: Unsteadiness on feet (R26.81);Other abnormalities of gait and mobility (R26.89);Muscle weakness (generalized) (M62.81);Other symptoms and signs involving cognitive function   Activity Tolerance Patient limited by pain   Patient Left in bed;with call bell/phone within reach;with bed alarm set   Nurse Communication          Time: 3212-2482 OT Time Calculation (min): 18 min  Charges: OT General Charges $OT Visit: 1 Visit OT Treatments $Self Care/Home Management : 8-22 mins  06/02/2020  Rich, OTR/L  Acute Rehabilitation Services  Office:  [phone]    Metta Clines 06/02/2020, 1:25 PM

## 2020-06-02 NOTE — Progress Notes (Addendum)
Pt had a large bowel movement this shift.  Some of the liquid stool got down to the leg immobilizer on the left leg.  Informed the on call hospitalist, who said the Ortho department deals in the pt's knee immobilizer.  The stool also had red streaks in it like blood. The physician said to just monitor it for now. Will inform the next nurse.  Lupita Dawn, RN

## 2020-06-02 NOTE — Progress Notes (Signed)
Patient ID: Katherine Benson, female   DOB: 1940-10-09, 79 y.o.   MRN: [id number] S: No new complaints this am. O:BP (!) 142/79 (BP Location: Right Arm)   Pulse 86   Temp 98.4 F (36.9 C) (Oral)   Resp 20   Ht 5' (1.524 m)   Wt 70.7 kg   SpO2 95%   BMI 30.44 kg/m   Intake/Output Summary (Last 24 hours) at 06/02/2020 1539 Last data filed at 06/02/2020 1108 Gross per 24 hour  Intake 690 ml  Output 2200 ml  Net -1510 ml   Intake/Output: I/O last 3 completed shifts: In: 64 [P.O.:810] Out: 3850 [Urine:3850]  Intake/Output this shift:  Total I/O In: 240 [P.O.:240] Out: 1050 [Urine:1050] Weight change:  Gen: NAD CVS: RRR, no rub Resp: cta Abd: +BS, soft,Nt/Nd Ext: trace edema of left leg, sutures c/d/i, no edema of RLE  Recent Labs  Lab 05/27/20 0356 05/27/20 1818 05/28/20 0647 05/28/20 1448 05/29/20 0403 05/29/20 0739 05/30/20 0326 05/30/20 0540 05/30/20 1544 05/30/20 1544 05/30/20 1936 05/30/20 2332 05/31/20 0449 05/31/20 1137 06/01/20 0204 06/01/20 1051 06/02/20 0124  NA 127*   < > 123*   < > 116*   < > 121*   < > 124*   < > 123* 127* 129* 130* 124* 128* 129*  K 3.4*  --  3.5  --  3.6  --  4.0  --  3.8  --   --   --  4.3  --  3.8  --  3.8  CL 91*  --  83*  --  82*  --  87*  --  87*  --   --   --  96*  --  91*  --  93*  CO2 28  --  28  --  27  --  25  --  26  --   --   --  25  --  25  --  27  GLUCOSE 98  --  100*  --  108*  --  89  --  120*  --   --   --  89  --  100*  --  97  BUN 5*  --  7*  --  7*  --  7*  --  15  --   --   --  16  --  12  --  17  CREATININE 0.52  --  0.54  --  0.51  --  0.50  --  0.56  --   --   --  0.46  --  0.46  --  0.52  CALCIUM 8.7*  --  8.7*  --  8.5*  --  8.3*  --  8.8*  --   --   --  8.3*  --  8.5*  --  8.9  PHOS 3.5  --   --   --   --   --   --   --   --   --   --   --   --   --   --   --   --    < > = values in this interval not displayed.   Liver Function Tests: No results for input(s): AST, ALT, ALKPHOS, BILITOT, PROT, ALBUMIN in  the last 168 hours. No results for input(s): LIPASE, AMYLASE in the last 168 hours. No results for input(s): AMMONIA in the last 168 hours. CBC: Recent Labs  Lab 05/27/20 0356 05/31/20 1137  WBC 5.8 8.5  HGB  10.0* 9.7*  HCT 30.8* 29.3*  MCV 91.1 92.4  PLT 325 407*   Cardiac Enzymes: No results for input(s): CKTOTAL, CKMB, CKMBINDEX, TROPONINI in the last 168 hours. CBG: Recent Labs  Lab 06/01/20 1121 06/01/20 1628  GLUCAP 112* 102*    Iron Studies: No results for input(s): IRON, TIBC, TRANSFERRIN, FERRITIN in the last 72 hours. Studies/Results: DG Chest 2 View  Result Date: 06/02/2020 CLINICAL DATA:  Hypoxia EXAM: CHEST - 2 VIEW COMPARISON:  05/19/2020 FINDINGS: Cardiomegaly. Bilateral pleural effusions suspected. Bilateral lower lobe airspace opacities. Findings of increased/worsened since prior study. No acute bony abnormality. IMPRESSION: Suspect bilateral pleural effusions with lower lobe atelectasis or infiltrates. Mild cardiomegaly. Electronically Signed   By: Rolm Baptise M.D.   On: 06/02/2020 11:50   DG Abd 2 Views  Result Date: 06/01/2020 CLINICAL DATA:  Abdominal pain EXAM: X-RAY ABDOMEN 2 VIEWS COMPARISON:  May 28, 2020 FINDINGS: Supine and upright images were obtained. There is no bowel dilatation or air-fluid level to suggest bowel obstruction. No free air. Tiny calcifications in the pelvis likely represent phleboliths. There is a total hip replacement on the left. There is airspace opacity in each lower lung region consistent with multifocal pneumonia. IMPRESSION: No bowel obstruction or free air evident. Multifocal pneumonia in the lung bases. Status post left total hip replacement. Electronically Signed   By: Lowella Grip III M.D.   On: 06/01/2020 12:05   . acetaminophen  650 mg Oral Q8H  . vitamin C  500 mg Oral Daily  . aspirin  325 mg Oral Daily  . bisacodyl  10 mg Rectal Once  . Chlorhexidine Gluconate Cloth  6 each Topical Daily  .  cholecalciferol  2,000 Units Oral BID  . dicyclomine  10 mg Oral BID AC & HS  . diltiazem  180 mg Oral QHS  . docusate sodium  100 mg Oral BID  . enoxaparin (LOVENOX) injection  40 mg Subcutaneous Q24H  . loratadine  10 mg Oral Daily  . losartan  100 mg Oral Daily  . multivitamin  1 tablet Oral Daily  . nutrition supplement (JUVEN)  1 packet Oral BID BM  . pantoprazole  40 mg Oral Daily  . polyethylene glycol  17 g Oral BID  . polyvinyl alcohol  1 drop Both Eyes BID  . triamcinolone  1 spray Nasal BID    BMET    Component Value Date/Time   NA 129 (L) 06/02/2020 0124   K 3.8 06/02/2020 0124   CL 93 (L) 06/02/2020 0124   CO2 27 06/02/2020 0124   GLUCOSE 97 06/02/2020 0124   BUN 17 06/02/2020 0124   CREATININE 0.52 06/02/2020 0124   CALCIUM 8.9 06/02/2020 0124   GFRNONAA >60 06/02/2020 0124   GFRAA >60 06/02/2020 0124   CBC    Component Value Date/Time   WBC 8.5 05/31/2020 1137   RBC 3.17 (L) 05/31/2020 1137   HGB 9.7 (L) 05/31/2020 1137   HCT 29.3 (L) 05/31/2020 1137   PLT 407 (H) 05/31/2020 1137   MCV 92.4 05/31/2020 1137   MCH 30.6 05/31/2020 1137   MCHC 33.1 05/31/2020 1137   RDW 14.0 05/31/2020 1137   LYMPHSABS 0.6 (L) 05/23/2020 0411   MONOABS 0.7 05/23/2020 0411   EOSABS 0.1 05/23/2020 0411   BASOSABS 0.0 05/23/2020 0411    Assessment/Plan:  1. 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  Sodium rose to 130 but then dropped  to 124 this morning only to increase to 128 this afternoon.   1. Continue to hold SSRI and fluid restrict to 1 liter/day.   2. She is asymptomatic so will continue to follow sodium levels.   3. No need for hypertonic saline. 4. Recheck urine osm is now lower than serum osm which is appropriate. 5. Serum sodium stable so will sign off for now.  Please call with questions or concerns. 2. 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 3. Left tibial plateau fracture s/p  ORIF 4. ABLA- following surgery.  Follow H/H and transfuse prn.  5. IBD/Crohns 6. HTN- stable 7. DM type 2 per primary 8. Urinary retention- follow PVR's and I/O cath prn.   [name], MD [employer] [phone]

## 2020-06-02 NOTE — Progress Notes (Signed)
Orthopedic Tech Progress Note Patient Details:  Katherine Benson [birth year] [id number]  When I arrived to apply knee immobilizer the pt had a ROM brace on so the knee immobilizer was left at bed side. Patient ID: Katherine Benson, female   DOB: 1941/01/08, 79 y.o.   MRN: [id number]   Tammy Sours 06/02/2020, 8:05 PM

## 2020-06-02 NOTE — Progress Notes (Signed)
Physical Therapy Treatment Patient Details Name: Katherine Benson MRN: [id number] DOB: Jan 04, 1941 Today's Date: 06/02/2020    History of Present Illness Patient is a 79 y/o female who presents after fall at rehab, + CPR after given opiod and s/p narcan. CT-comminuted fx of left proximal tibia, lateral>medial tibial plateau now s/p ORIF 9/9. PMH includes recently hospitalization in [place] for a Lft tibial plateau fracture and discharged to rehab, MGUS, HTN, DM.    PT Comments    Pt is progressing slowly toward goals, limited by anxiety of progressing to the next level, from EOB to OOB in the chair.  Emphasis today on exercise, bridging to EOB, transition to sitting and balance at EOB with scoot transfer practice up toward [facility].    Follow Up Recommendations  SNF     Equipment Recommendations  None recommended by PT    Recommendations for Other Services       Precautions / Restrictions Precautions Precautions: Fall;Other (comment) Precaution Comments: watch O2 Restrictions LLE Weight Bearing: Non weight bearing    Mobility  Bed Mobility Overal bed mobility: Needs Assistance Bed Mobility: Rolling;Supine to Sit;Sit to Supine Rolling: Max assist;+2 for physical assistance   Supine to sit: Mod assist;+2 for physical assistance Sit to supine: Mod assist;+2 for physical assistance   General bed mobility comments: Pt anxious about moving, but helped out the more she accomplished.  Pt needed consistent cuing for encouragement to try the next task.  Transfers Overall transfer level: Needs assistance   Transfers: Lateral/Scoot Transfers          Lateral/Scoot Transfers: Mod assist;+2 physical assistance (with use of the pad to assist) General transfer comment: pt declined OOB, but worked on coming forward and use of bil UE's to [employer] her buttocks and scoot to the Left toward [facility]  Ambulation/Gait             General Gait Details: not able   Location manager    Modified Rankin (Stroke Patients Only)       Balance Overall balance assessment: Needs assistance Sitting-balance support: Feet supported;Bilateral upper extremity supported Sitting balance-Leahy Scale: Poor Sitting balance - Comments: reliant on 1 or even better 2 UE's to sit without extern assist, but spent approx 10 min  EOB.                                     Cognition Arousal/Alertness: Awake/alert Behavior During Therapy: WFL for tasks assessed/performed Overall Cognitive Status: Impaired/Different from baseline                   Orientation Level: Situation;Time Current Attention Level: Selective Memory: Decreased recall of precautions;Decreased short-term memory Following Commands: Follows one step commands with increased time;Follows one step commands inconsistently Safety/Judgement: Decreased awareness of deficits Awareness: Intellectual Problem Solving: Slow processing;Decreased initiation;Requires verbal cues        Exercises General Exercises - Lower Extremity Ankle Circles/Pumps: Both;5 reps;Other (comment) (asked pt to do for homework) Heel Slides: AROM;Right;10 reps (graded resistance in gross extension) Hip ABduction/ADduction: AAROM;Both;10 reps Straight Leg Raises: Both;AROM;AAROM;10 reps    General Comments General comments (skin integrity, edema, etc.): sats on RA (unintentionally disconnected) maintained at 91%.  With mobility, sats dropped into the upper 80's with quick return to the lower to mid 90's on .5 L      Pertinent  Vitals/Pain Pain Assessment: Faces Faces Pain Scale: Hurts little more Pain Location: LLE Pain Descriptors / Indicators: Grimacing;Guarding (straps tight and grabbing) Pain Intervention(s): Monitored during session;Limited activity within patient's tolerance    Home Living                      Prior Function            PT Goals (current goals can now be found  in the care plan section) Acute Rehab PT Goals PT Goal Formulation: With patient Time For Goal Achievement: 06/05/20 Potential to Achieve Goals: Fair Progress towards PT goals: Progressing toward goals    Frequency    Min 3X/week      PT Plan Current plan remains appropriate    Co-evaluation PT/OT/SLP Co-Evaluation/Treatment: Yes Reason for Co-Treatment: To address functional/ADL transfers;For patient/therapist safety PT goals addressed during session: Mobility/safety with mobility        AM-PAC PT "6 Clicks" Mobility   Outcome Measure  Help needed turning from your back to your side while in a flat bed without using bedrails?: Total Help needed moving from lying on your back to sitting on the side of a flat bed without using bedrails?: A Lot Help needed moving to and from a bed to a chair (including a wheelchair)?: Total Help needed standing up from a chair using your arms (e.g., wheelchair or bedside chair)?: Total Help needed to walk in hospital room?: Total Help needed climbing 3-5 steps with a railing? : Total 6 Click Score: 7    End of Session Equipment Utilized During Treatment: Oxygen Activity Tolerance: Patient tolerated treatment well Patient left: in bed;with call bell/phone within reach;with bed alarm set;Other (comment) (pt declined OOB, but was told that next session was OOB) Nurse Communication: Mobility status PT Visit Diagnosis: Pain;Muscle weakness (generalized) (M62.81);Repeated falls (R29.6) Pain - Right/Left: Left Pain - part of body: Leg     Time: 1914-7829 PT Time Calculation (min) (ACUTE ONLY): 36 min  Charges:  $Therapeutic Activity: 8-22 mins                     06/02/2020  Ginger Carne., PT Acute Rehabilitation Services [phone]  (pager) [phone]  (office)   Katherine Benson 06/02/2020, 1:17 PM

## 2020-06-02 NOTE — Progress Notes (Signed)
PROGRESS NOTE    Katherine Benson  OEV:[id number] DOB: 12/16/1940 DOA: 05/19/2020 PCP: Heber Cutten, MD   Brief Narrative: Katherine Benson is a 79 y.o. female with a history of MGUS, Crohn's disease, diabetes mellitus, hypertension.  Patient presented secondary to fall was found to have comminuted tibial plateau fracture in setting of previous tibial fracture.  Orthopedic surgery was consulted and patient underwent ORIF on 9/9.  Hospitalization complicated by hyponatremia.   Assessment & Plan:   Principal Problem:   Fall at home, initial encounter Active Problems:   Unresponsive episode   Hypertension   MGUS (monoclonal gammopathy of unknown significance)   Crohn disease ([place])   Diabetes mellitus type 2 in obese ([place])   Tibial plateau fracture, left, closed, with delayed healing, subsequent encounter   Acute hyponatremia   Pressure injury of skin   Tibial plateau fracture Patient with previously known fracture that was managed non-operatively. On current admission, evidence of comminuted fracture. Orthopedic surgery consulted and patient underwent ORIF on 9/9. 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 Urine sodium/osmolality obtained suggests possible adrenal insufficiency but ACTH stimulation test was negative. No associated hypotension noted but rather hypertension. Patient with a low sodium of 116 for which nephrology started hypertonic saline (3%) @ 20 ml/hr with improvement. Sodium improved to 130 off of hypertonic saline with worsening back down to 124. Now back to 129 today -Nephrology recommendations: pending today  Unresponsiveness Accidental opioid overdose Occurred after Fentanyl during EMS transport. Patient underwent chest compressions and Narcan with resolution.  Confusion Concern there may be underlying  dementia. Likely exacerbated by hyponatremia. Initially improved and has worsened slightly with worsened sodium. Seems baseline.  Essential hypertension -Continue Diltiazem, losartan  MGUS Surveillance currently. Follows with hematology/oncology  Crohn disease Follows with GI as an outpatient. Currently not on medication.  Diabetes mellitus, type 2 Hemoglobin A1C of 6.1%. not on medication as an outpatient  Abdominal distension Likely secondary to constipation. Abdominal ultrasound obtained without etiology. Had a large bowel movement on 9/17. Still with mild distention and mild pain but improved. No bowel obstruction or ileus on abdominal x-ray. Having bowel movements. 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 No clinical symptoms that correlate with pneumonia. -2 view chest x-ray  Acute urinary retention E. Coli/Klebsiella pneumoniae UTI Patient with persistent retention. Foley catheter placed on 9/18. Urinalysis unremarkable except for pyuria. Urine culture obtained and is pending. No medications on list I would expect to cause this. Urine culture significant for E. Coli and K. Pneumoniae bacteruria. Ciprofloxacin started 9/19 secondary to penicillin allergy however E. Coli is resistant. Fosfomycin dose given on 9/20 -Voiding trial in 24 hours  Anxiety Patient appears to have significant anxiety. She states she was being managed for this in the past but is currently not on any treatment. She states she is scared but cannot tell me specifically why she is scared. May benefit from outpatient psychotherapy.   DVT prophylaxis: Lovenox Code Status:   Code Status: Full Code Family Communication: Son on telephone (6 minutes) Disposition Plan:  Discharge to SNF likely in 2-4 days pending improvement of hyponatremia, evaluation of lung infiltrates   Consultants:   Orthopedic surgery  Nephrology  PCCM  Procedures:   LEFT  ORIF (05/21/2020)  Antimicrobials:  Vancomycin/Clindamycin (Surgical Prophylaxis)  Ciprofloxacin  Fosfomycin   Subjective: No  concerns today. Feels well.  Objective: Vitals:   06/01/20 2044 06/02/20 0020 06/02/20 0507 06/02/20 0831  BP: (!) 168/98 133/77 (!) 154/79 (!) 156/88  Pulse: 96 86 82 88  Resp: (!) 22 18 17  (!) 22  Temp: 98 F (36.7 C) 98.3 F (36.8 C) 98.4 F (36.9 C) 98.1 F (36.7 C)  TempSrc: Oral Oral Oral Oral  SpO2: 96% 97% 96% 96%  Weight:      Height:        Intake/Output Summary (Last 24 hours) at 06/02/2020 0925 Last data filed at 06/02/2020 0854 Gross per 24 hour  Intake 570 ml  Output 3100 ml  Net -2530 ml   Filed Weights   05/21/20 1215  Weight: 70.7 kg    Examination:  General exam: Appears calm and comfortable Respiratory system: Diminished but clear to auscultation. Respiratory effort normal. Cardiovascular system: S1 & S2 heard, RRR. Systolic murmur. Gastrointestinal system: Abdomen is mildly distended, soft and mildly tender. No organomegaly or masses felt. Normal bowel sounds heard. Central nervous system: Alert and oriented to person and place. No focal neurological deficits. Musculoskeletal: No edema. No calf tenderness Skin: No cyanosis. No rashes Psychiatry: Judgement and insight appear normal. Mood & affect appropriate.     Data Reviewed: I have personally reviewed following labs and imaging studies  CBC Lab Results  Component Value Date   WBC 8.5 05/31/2020   RBC 3.17 (L) 05/31/2020   HGB 9.7 (L) 05/31/2020   HCT 29.3 (L) 05/31/2020   MCV 92.4 05/31/2020   MCH 30.6 05/31/2020   PLT 407 (H) 05/31/2020   MCHC 33.1 05/31/2020   RDW 14.0 05/31/2020   LYMPHSABS 0.6 (L) 05/23/2020   MONOABS 0.7 05/23/2020   EOSABS 0.1 05/23/2020   BASOSABS 0.0 54/00/8676     Last metabolic panel Lab Results  Component Value Date   NA 129 (L) 06/02/2020   K 3.8 06/02/2020   CL 93 (L) 06/02/2020   CO2 27 06/02/2020   BUN 17  06/02/2020   CREATININE 0.52 06/02/2020   GLUCOSE 97 06/02/2020   GFRNONAA >60 06/02/2020   GFRAA >60 06/02/2020   CALCIUM 8.9 06/02/2020   PHOS 3.5 05/27/2020   ANIONGAP 9 06/02/2020    CBG (last 3)  Recent Labs    06/01/20 1121 06/01/20 1628  GLUCAP 112* 102*     GFR: Estimated Creatinine Clearance: 51.7 mL/min (by C-G formula based on SCr of 0.52 mg/dL).  Coagulation Profile: No results for input(s): INR, PROTIME in the last 168 hours.  Recent Results (from the past 240 hour(s))  SARS Coronavirus 2 by RT PCR (hospital order, performed in [facility] hospital lab) Nasopharyngeal Nasopharyngeal Swab     Status: None   Collection Time: 05/24/20  7:29 AM   Specimen: Nasopharyngeal Swab  Result Value Ref Range Status   SARS Coronavirus 2 NEGATIVE NEGATIVE Final    Comment: (NOTE) SARS-CoV-2 target nucleic acids are NOT DETECTED.  The SARS-CoV-2 RNA is generally detectable in upper and lower respiratory specimens during the acute phase of infection. The lowest concentration of SARS-CoV-2 viral copies this assay can detect is 250 copies / mL. A negative result does not preclude SARS-CoV-2 infection and should not be used as the sole basis for treatment or other patient management decisions.  A negative result may occur with improper specimen collection / handling, submission of specimen other than nasopharyngeal swab, presence of viral mutation(s) within the areas targeted by this assay, and inadequate number of viral copies (<250  copies / mL). A negative result must be combined with clinical observations, patient history, and epidemiological information.  Fact Sheet for Patients:   StrictlyIdeas.no  Fact Sheet for Healthcare Providers: BankingDealers.co.za  This test is not yet approved or  cleared by the [place] FDA and has been authorized for detection and/or diagnosis of SARS-CoV-2 by FDA under an Emergency Use  Authorization (EUA).  This EUA will remain in effect (meaning this test can be used) for the duration of the COVID-19 declaration under Section 564(b)(1) of the Act, 21 U.S.C. section 360bbb-3(b)(1), unless the authorization is terminated or revoked sooner.  Performed at [facility], [place] [address], [place], [place] [address]   Culture, Urine     Status: Abnormal   Collection Time: 05/30/20  3:04 PM   Specimen: Urine, Clean Catch  Result Value Ref Range Status   Specimen Description URINE, CLEAN CATCH  Final   Special Requests   Final    NONE Performed at [facility], [place] [address] [place], [place] [address]    Culture (A)  Final    >=100,000 COLONIES/mL ESCHERICHIA COLI >=100,000 COLONIES/mL KLEBSIELLA PNEUMONIAE    Report Status 06/02/2020 FINAL  Final   Organism ID, Bacteria ESCHERICHIA COLI (A)  Final   Organism ID, Bacteria KLEBSIELLA PNEUMONIAE (A)  Final      Susceptibility   Escherichia coli - MIC*    AMPICILLIN <=2 SENSITIVE Sensitive     CEFAZOLIN <=4 SENSITIVE Sensitive     CEFTRIAXONE <=0.25 SENSITIVE Sensitive     CIPROFLOXACIN >=4 RESISTANT Resistant     GENTAMICIN <=1 SENSITIVE Sensitive     IMIPENEM <=0.25 SENSITIVE Sensitive     NITROFURANTOIN <=16 SENSITIVE Sensitive     TRIMETH/SULFA <=20 SENSITIVE Sensitive     AMPICILLIN/SULBACTAM <=2 SENSITIVE Sensitive     PIP/TAZO <=4 SENSITIVE Sensitive     * >=100,000 COLONIES/mL ESCHERICHIA COLI   Klebsiella pneumoniae - MIC*    AMPICILLIN RESISTANT Resistant     CEFAZOLIN <=4 SENSITIVE Sensitive     CEFTRIAXONE <=0.25 SENSITIVE Sensitive     CIPROFLOXACIN <=0.25 SENSITIVE Sensitive     GENTAMICIN <=1 SENSITIVE Sensitive     IMIPENEM <=0.25 SENSITIVE Sensitive     NITROFURANTOIN <=16 SENSITIVE Sensitive     TRIMETH/SULFA <=20 SENSITIVE Sensitive     AMPICILLIN/SULBACTAM 4 SENSITIVE Sensitive     PIP/TAZO <=4 SENSITIVE Sensitive     * >=100,000 COLONIES/mL KLEBSIELLA PNEUMONIAE         Radiology Studies: DG Abd 2 Views  Result Date: 06/01/2020 CLINICAL DATA:  Abdominal pain EXAM: X-RAY ABDOMEN 2 VIEWS COMPARISON:  May 28, 2020 FINDINGS: Supine and upright images were obtained. There is no bowel dilatation or air-fluid level to suggest bowel obstruction. No free air. Tiny calcifications in the pelvis likely represent phleboliths. There is a total hip replacement on the left. There is airspace opacity in each lower lung region consistent with multifocal pneumonia. IMPRESSION: No bowel obstruction or free air evident. Multifocal pneumonia in the lung bases. Status post left total hip replacement. Electronically Signed   By: Lowella Grip III M.D.   On: 06/01/2020 12:05        Scheduled Meds: . acetaminophen  650 mg Oral Q8H  . vitamin C  500 mg Oral Daily  . aspirin  325 mg Oral Daily  . bisacodyl  10 mg Rectal Once  . Chlorhexidine Gluconate Cloth  6 each Topical Daily  . cholecalciferol  2,000 Units Oral BID  .  dicyclomine  10 mg Oral BID AC & HS  . diltiazem  180 mg Oral QHS  . docusate sodium  100 mg Oral BID  . enoxaparin (LOVENOX) injection  40 mg Subcutaneous Q24H  . loratadine  10 mg Oral Daily  . losartan  100 mg Oral Daily  . multivitamin  1 tablet Oral Daily  . nutrition supplement (JUVEN)  1 packet Oral BID BM  . pantoprazole  40 mg Oral Daily  . polyethylene glycol  17 g Oral BID  . polyvinyl alcohol  1 drop Both Eyes BID  . triamcinolone  1 spray Nasal BID   Continuous Infusions: . methocarbamol (ROBAXIN) IV       LOS: 13 days     Cordelia Poche, MD [facility] 06/02/2020, 9:25 AM  If 7PM-7AM, please contact night-coverage [email or web address]

## 2020-06-03 ENCOUNTER — Inpatient Hospital Stay (HOSPITAL_COMMUNITY): Payer: Medicare HMO

## 2020-06-03 DIAGNOSIS — S82142G Displaced bicondylar fracture of left tibia, subsequent encounter for closed fracture with delayed healing: Secondary | ICD-10-CM

## 2020-06-03 DIAGNOSIS — D472 Monoclonal gammopathy: Secondary | ICD-10-CM

## 2020-06-03 DIAGNOSIS — E1169 Type 2 diabetes mellitus with other specified complication: Secondary | ICD-10-CM

## 2020-06-03 DIAGNOSIS — L89151 Pressure ulcer of sacral region, stage 1: Secondary | ICD-10-CM

## 2020-06-03 DIAGNOSIS — R4189 Other symptoms and signs involving cognitive functions and awareness: Secondary | ICD-10-CM

## 2020-06-03 DIAGNOSIS — E669 Obesity, unspecified: Secondary | ICD-10-CM

## 2020-06-03 MED ORDER — HYDROCODONE-ACETAMINOPHEN 5-325 MG PO TABS
1.0000 | ORAL_TABLET | Freq: Four times a day (QID) | ORAL | 0 refills | Status: DC | PRN
Start: 2020-06-03 — End: 2024-01-03

## 2020-06-03 MED ORDER — ENOXAPARIN SODIUM 40 MG/0.4ML ~~LOC~~ SOLN: 40.0000 mg | SUBCUTANEOUS | 0 refills | Status: AC

## 2020-06-03 NOTE — Progress Notes (Signed)
Physical Therapy Treatment Patient Details Name: Katherine Benson MRN: [id number] DOB: July 01, 1941 Today's Date: 06/03/2020    History of Present Illness Patient is a 79 y/o female who presents after fall at rehab, + CPR after given opiod and s/p narcan. CT-comminuted fx of left proximal tibia, lateral>medial tibial plateau now s/p ORIF 9/9. PMH includes recently hospitalization in [place] for a Lft tibial plateau fracture and discharged to rehab, MGUS, HTN, DM.    PT Comments    Pt slow to progress toward goals.  She is anxious/fearful of transitions and transfers.  Emphasis on exercise, transition to EOB, sit balance at EOB, pivot transfer to chair and noticing pt was soiled in the chair, pt was stood at chair for pericare.    Follow Up Recommendations  SNF     Equipment Recommendations  None recommended by PT    Recommendations for Other Services       Precautions / Restrictions Precautions Precautions: Fall Precaution Comments: O2 sats Required Braces or Orthoses: Knee Immobilizer - Left Knee Immobilizer - Left: On at all times Other Brace: Bledsoe brace LLE now can complete P/AAROM to 30* flexion Restrictions RLE Weight Bearing: Weight bearing as tolerated LLE Weight Bearing: Non weight bearing    Mobility  Bed Mobility Overal bed mobility: Needs Assistance Bed Mobility: Supine to Sit     Supine to sit: Mod assist;Max assist;+2 for physical assistance     General bed mobility comments: pt appeared more anxious/scared today during mobility with less overall assist during transition.  Transfers Overall transfer level: Needs assistance   Transfers: Sit to/from Stand;Stand Pivot Transfers Sit to Stand: Max assist Stand pivot transfers: Max assist;+2 safety/equipment       General transfer comment: used face to face assist to stand at chair for peri-care due to loose stool and stand pivot from bed to chair also with face to face assist, cues for direction,  reinforcement of NWB status L LE.  Ambulation/Gait             General Gait Details: not able   Stairs             Wheelchair Mobility    Modified Rankin (Stroke Patients Only)       Balance Overall balance assessment: Needs assistance Sitting-balance support: Feet supported;Bilateral upper extremity supported;Single extremity supported Sitting balance-Leahy Scale: Poor Sitting balance - Comments: reliant on 1 to 2 UE's for stability at EOB     Standing balance-Leahy Scale: Zero Standing balance comment: reliant on external support                            Cognition Arousal/Alertness: Awake/alert Behavior During Therapy: WFL for tasks assessed/performed Overall Cognitive Status: Impaired/Different from baseline                   Orientation Level: Time;Situation Current Attention Level: Selective Memory: Decreased recall of precautions;Decreased short-term memory Following Commands: Follows one step commands with increased time;Follows one step commands inconsistently Safety/Judgement: Decreased awareness of deficits Awareness: Intellectual Problem Solving: Slow processing;Decreased initiation;Requires verbal cues        Exercises Other Exercises Other Exercises: PROM to L LE at knee ~15*, hip/knee flex/ext with resisted extension R LE    General Comments General comments (skin integrity, edema, etc.): sats sitting EOB without O2 dropped to mid 80's, but with 1L or less, SpO2 adequate in the low 90's      Pertinent Vitals/Pain Pain Assessment:  Faces Faces Pain Scale: Hurts little more Pain Location: LLE Pain Descriptors / Indicators: Grimacing;Guarding Pain Intervention(s): Monitored during session    Home Living                      Prior Function            PT Goals (current goals can now be found in the care plan section) Acute Rehab PT Goals Patient Stated Goal: Improve EOB sitting for lateral scoot  transfer.  Begin UB ADL seated EOB. PT Goal Formulation: With patient Time For Goal Achievement: 06/05/20 Potential to Achieve Goals: Fair Progress towards PT goals: Progressing toward goals    Frequency    Min 3X/week      PT Plan Current plan remains appropriate    Co-evaluation              AM-PAC PT "6 Clicks" Mobility   Outcome Measure  Help needed turning from your back to your side while in a flat bed without using bedrails?: A Lot Help needed moving from lying on your back to sitting on the side of a flat bed without using bedrails?: Total Help needed moving to and from a bed to a chair (including a wheelchair)?: Total Help needed standing up from a chair using your arms (e.g., wheelchair or bedside chair)?: Total Help needed to walk in hospital room?: Total Help needed climbing 3-5 steps with a railing? : Total 6 Click Score: 7    End of Session Equipment Utilized During Treatment: Oxygen Activity Tolerance: Patient tolerated treatment well Patient left: in chair;with call bell/phone within reach;with chair alarm set;Other (comment) (maxi lift pad) Nurse Communication: Mobility status PT Visit Diagnosis: Pain;Muscle weakness (generalized) (M62.81);Repeated falls (R29.6);Difficulty in walking, not elsewhere classified (R26.2) Pain - Right/Left: Left Pain - part of body: Leg     Time: 4035-2481 PT Time Calculation (min) (ACUTE ONLY): 31 min  Charges:  $Therapeutic Activity: 23-37 mins                     06/03/2020  Ginger Carne., PT Acute Rehabilitation Services [phone]  (pager) [phone]  (office)   Tessie Fass Tresea Heine 06/03/2020, 5:35 PM

## 2020-06-03 NOTE — Progress Notes (Signed)
Orthopedic Tech Progress Note Patient Details:  Katherine Benson [birth year] [id number] Ordered patient new ROM knee brace per RN calling to request. Patient ID: [name], female   DOB: 1941/01/24, 79 y.o.   MRN: [id number]   Chip Boer 06/03/2020, 6:46 PM

## 2020-06-03 NOTE — Progress Notes (Signed)
PROGRESS NOTE  Katherine Benson MGQ:[id number] DOB: 10-Jun-1941 DOA: 05/19/2020 PCP: Heber Midway, MD   LOS: 14 days   Brief narrative: As per HPI,   Katherine Benson is a 79 y.o. female with a history of MGUS, Crohn's disease, diabetes mellitus, hypertension presented to hospital status post fall.  Patient was noted to have comminuted tibial plateau fracture   Orthopedic surgery was consulted and patient underwent ORIF on 9/9.  Hospitalization complicated by hyponatremia.  Assessment/Plan:  Principal Problem:   Fall at home, initial encounter Active Problems:   Unresponsive episode   Hypertension   MGUS (monoclonal gammopathy of unknown significance)   Crohn disease ([place])   Diabetes mellitus type 2 in obese ([place])   Tibial plateau fracture, left, closed, with delayed healing, subsequent encounter   Acute hyponatremia   Pressure injury of skin   Left tibial plateau fracture Patient had previously known fracture which was managed conservatively.  On this admission had comminuted fracture so underwent open reduction and internal fixation on 05/21/2020.  Patient is  NWB of LLE, hinge knee brace of LLE (locked in full extension), PRAFO boot as needed.  Aspirin 325 mg daily.  Orthopedic has seen the patient and recommend outpatient follow-up in 2 weeks.  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  Has been discontinued. ACTH stimulation test was negative.  Patient received hypertonic saline (3%) @ 20 ml/hr with improvement.  Seen by nephrology.  Sodium from 06/02/2020 at 129.  Labs from today pending.  Continue fluid restriction at 1 L/min.  Accidental opioid overdose During EMS transport with fentanyl.  Underwent chest compressions and Narcan with resolution.   Confusion Could be secondary to hyponatremia and dementia.  Stable at this time.  Essential hypertension -Continue Diltiazem, losartan.  Monitor blood pressure closely.  MGUS Surveillance currently. Follows with  hematology/oncology as outpatient.  Crohn disease Not on any medication.  Follows up with GI as outpatient.  Currently stable.  Diabetes mellitus, type 2 Has known Hemoglobin A1C of 6.1%.  Not on medications as outpatient.  Abdominal distension  from constipation.  Improved.  Continue Colace, MiraLAX, Dulcolax  Lung infiltrates Chest x-ray showed small bilateral pleural effusion.  No pulmonary symptoms.  Will need outpatient follow-up.  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  Will do voiding trial.  E. Coli/Klebsiella pneumoniae UTI  Urine culture significant for E. Coli and K. Pneumoniae bacteruria.  E. coli resistant to Cipro so fosfomycin was given on 9/20.   Anxiety Will benefit from outpatient psychotherapy.  Ativan as needed.  DVT prophylaxis: enoxaparin (LOVENOX) injection 40 mg Start: 05/22/20 0900 Foot Pump / plexipulse Start: 05/19/20 0817 SCDs Start: 05/19/20 0815   Code Status: Full code  Family Communication: None  Status is: Inpatient  Remains inpatient appropriate because:IV treatments appropriate due to intensity of illness or inability to take PO and Inpatient level of care appropriate due to severity of illness   Dispo: The patient is from: Home              Anticipated d/c is to: SNF              Anticipated d/c date is: 2 days              Patient currently is not medically stable to d/c.   Consultants:  Orthopedic surgery  Nephrology  PCCM  Procedures:  LEFT ORIF (05/21/2020)  Antibiotics:   Vancomycin/Clindamycin (Surgical Prophylaxis)  Ciprofloxacin  Fosfomycin  Anti-infectives (From admission, onward)   Start     Dose/Rate Route Frequency Ordered Stop   06/01/20 1400  fosfomycin (MONUROL) packet 3 g        3 g Oral  Once 06/01/20 1300 06/01/20 1428   05/31/20 1330  ciprofloxacin (CIPRO) tablet 250 mg  Status:  Discontinued        250 mg Oral 2 times daily 05/31/20 1148  06/01/20 1300   05/21/20 1630  clindamycin (CLEOCIN) IVPB 900 mg       Note to Pharmacy: Please give 8 hours from intraoperative dose   900 mg 100 mL/hr over 30 Minutes Intravenous Every 8 hours 05/21/20 1247 05/22/20 0921   05/21/20 1003  vancomycin (VANCOCIN) powder  Status:  Discontinued          As needed 05/21/20 1003 05/21/20 1031     Subjective: Today, patient was seen and examined at bedside.  Patient complains of a few bowel movements after bowel regimen.  Denies any nausea vomiting abdominal pain.  Objective: Vitals:   06/03/20 0805 06/03/20 1137  BP: (!) 150/76 135/75  Pulse: 88 80  Resp: 19 (!) 22  Temp: 98.6 F (37 C) 98 F (36.7 C)  SpO2: 92% 94%    Intake/Output Summary (Last 24 hours) at 06/03/2020 1434 Last data filed at 06/03/2020 1351 Gross per 24 hour  Intake 560 ml  Output 1400 ml  Net -840 ml   Filed Weights   05/21/20 1215  Weight: 70.7 kg   Body mass index is 30.44 kg/m.   Physical Exam: GENERAL: Patient is alert awake and oriented. Not in obvious distress. HENT: No scleral pallor or icterus. Pupils equally reactive to light. Oral mucosa is moist NECK: is supple, no gross swelling noted. CHEST: Clear to auscultation. No crackles or wheezes.  Diminished breath sounds bilaterally. CVS: S1 and S2 heard, systolic murmur.  Regular rate and rhythm.  ABDOMEN: Soft, mildly distended, non-tender, bowel sounds are present.  Foley catheter in place. EXTREMITIES: Left lower extremity-hinged brace in place locked in extension.  Sutures in place, clean dry and intact.  Patient is able to wiggle toes.  Distal pulses are palpable. CNS: Cranial nerves are intact. No focal motor deficits. SKIN: warm and dry without rashes.  Stage I coccyx pressure ulceration.  Data Review: I have personally reviewed the following laboratory data and studies,  CBC: Recent Labs  Lab 05/31/20 1137  WBC 8.5  HGB 9.7*  HCT 29.3*  MCV 92.4  PLT 725*   Basic Metabolic  Panel: Recent Labs  Lab 05/30/20 0326 05/30/20 0326 05/30/20 0540 05/30/20 0747 05/30/20 1544 05/30/20 1936 05/31/20 0449 05/31/20 1137 06/01/20 0204 06/01/20 1051 06/02/20 0124  NA 121*   < > 122*   < > 124*   < > 129* 130* 124* 128* 129*  K 4.0  --   --   --  3.8  --  4.3  --  3.8  --  3.8  CL 87*  --   --   --  87*  --  96*  --  91*  --  93*  CO2 25  --   --   --  26  --  25  --  25  --  27  GLUCOSE 89  --   --   --  120*  --  89  --  100*  --  97  BUN 7*  --   --   --  15  --  16  --  12  --  17  CREATININE 0.50  --   --   --  0.56  --  0.46  --  0.46  --  0.52  CALCIUM 8.3*  --   --   --  8.8*  --  8.3*  --  8.5*  --  8.9  MG  --   --  1.9  --   --   --   --   --   --   --   --    < > = values in this interval not displayed.   Liver Function Tests: No results for input(s): AST, ALT, ALKPHOS, BILITOT, PROT, ALBUMIN in the last 168 hours. No results for input(s): LIPASE, AMYLASE in the last 168 hours. No results for input(s): AMMONIA in the last 168 hours. Cardiac Enzymes: No results for input(s): CKTOTAL, CKMB, CKMBINDEX, TROPONINI in the last 168 hours. BNP (last 3 results) No results for input(s): BNP in the last 8760 hours.  ProBNP (last 3 results) No results for input(s): PROBNP in the last 8760 hours.  CBG: Recent Labs  Lab 06/01/20 1121 06/01/20 1628  GLUCAP 112* 102*   Recent Results (from the past 240 hour(s))  Culture, Urine     Status: Abnormal   Collection Time: 05/30/20  3:04 PM   Specimen: Urine, Clean Catch  Result Value Ref Range Status   Specimen Description URINE, CLEAN CATCH  Final   Special Requests   Final    NONE Performed at [facility], [place] [address] [place], [place] [address]    Culture (A)  Final    >=100,000 COLONIES/mL ESCHERICHIA COLI >=100,000 COLONIES/mL KLEBSIELLA PNEUMONIAE    Report Status 06/02/2020 FINAL  Final   Organism ID, Bacteria ESCHERICHIA COLI (A)  Final   Organism ID, Bacteria KLEBSIELLA PNEUMONIAE  (A)  Final      Susceptibility   Escherichia coli - MIC*    AMPICILLIN <=2 SENSITIVE Sensitive     CEFAZOLIN <=4 SENSITIVE Sensitive     CEFTRIAXONE <=0.25 SENSITIVE Sensitive     CIPROFLOXACIN >=4 RESISTANT Resistant     GENTAMICIN <=1 SENSITIVE Sensitive     IMIPENEM <=0.25 SENSITIVE Sensitive     NITROFURANTOIN <=16 SENSITIVE Sensitive     TRIMETH/SULFA <=20 SENSITIVE Sensitive     AMPICILLIN/SULBACTAM <=2 SENSITIVE Sensitive     PIP/TAZO <=4 SENSITIVE Sensitive     * >=100,000 COLONIES/mL ESCHERICHIA COLI   Klebsiella pneumoniae - MIC*    AMPICILLIN RESISTANT Resistant     CEFAZOLIN <=4 SENSITIVE Sensitive     CEFTRIAXONE <=0.25 SENSITIVE Sensitive     CIPROFLOXACIN <=0.25 SENSITIVE Sensitive     GENTAMICIN <=1 SENSITIVE Sensitive     IMIPENEM <=0.25 SENSITIVE Sensitive     NITROFURANTOIN <=16 SENSITIVE Sensitive     TRIMETH/SULFA <=20 SENSITIVE Sensitive     AMPICILLIN/SULBACTAM 4 SENSITIVE Sensitive     PIP/TAZO <=4 SENSITIVE Sensitive     * >=100,000 COLONIES/mL KLEBSIELLA PNEUMONIAE     Studies: DG Chest 2 View  Result Date: 06/02/2020 CLINICAL DATA:  Hypoxia EXAM: CHEST - 2 VIEW COMPARISON:  05/19/2020 FINDINGS: Cardiomegaly. Bilateral pleural effusions suspected. Bilateral lower lobe airspace opacities. Findings of increased/worsened since prior study. No acute bony abnormality. IMPRESSION: Suspect bilateral pleural effusions with lower lobe atelectasis or infiltrates. Mild cardiomegaly. Electronically Signed   By: Rolm Baptise M.D.   On: 06/02/2020 11:50   DG Knee 1-2 Views Left  Result Date: 06/03/2020 CLINICAL DATA:  Postop LEFT knee  surgery EXAM: LEFT KNEE - 1-2 VIEW COMPARISON:  05/21/2020 FINDINGS: Subcutaneous edema within the fat suggested grossly similar to previous evaluation. Signs of repair of tibial fractures and plateau injuries with lateral cortical plate and screw fixation showing no change. No joint effusion. Patellar enthesopathy. IMPRESSION: Interval  ORIF of comminuted tibial fractures involving the articular surface. No signs of hardware complication. Electronically Signed   By: Zetta Bills M.D.   On: 06/03/2020 09:28      Flora Lipps, MD  [facility] 06/03/2020

## 2020-06-03 NOTE — Progress Notes (Addendum)
Orthopaedic Trauma Progress Note  HPI: Doing okay this morning. Pain in leg manageable. No orthopedic complaints currently  Physical Exam: General: Laying in bed, NAD Respiratory:  No increased work of breathing.  Extremity: Hinge brace in place, locked in extension. Sutures removed, incisions C/D/I. Ankle dorsiflexion/plantarflexion intact. Wiggles toes. Endorses sensation to light touch distally. + DP pulse  Assessment: 79 year old female s/p fall, 13 Days Post-Op  Injuries:  Left bicondylar tibial plateau fracture s/p ORIF   Plan: Weightbearing: NWB LLE   Insicional and dressing care:  Ok to leave incisions open to air  Showering:  Okay to shower with assistance   Orthopedic device(s): Hinge knee brace LLE.  Ok to begin knee ROM 0-30 degrees in hinge brace as tolerated. Ok to d/c [facility] boot, only apply as needed  Pain management: Continue current regimen  VTE prophylaxis: Lovenox, SCDs  Impediments to Fracture Healing: Vit D level 24, continue D3 supplementation  Dispo: Therapies as tolerated, ok to begin gentle ROM as above. Repeat imaging of left knee ordered for today. Patient ok to return to SNF from ortho standpoint once cleared by therapies and medicine team. Have signed and placed d/c rx for pain medication and DVT prophylaxis in chart  Follow - up plan: 2 weeks  Contact information:  Katha Hamming MD, Patrecia Pace PA-C   [facility] [phone] (office) [email or web address]

## 2020-06-04 LAB — BASIC METABOLIC PANEL
Anion gap: 10 (ref 5–15)
BUN: 17 mg/dL (ref 8–23)
CO2: 27 mmol/L (ref 22–32)
Calcium: 9.3 mg/dL (ref 8.9–10.3)
Chloride: 93 mmol/L — ABNORMAL LOW (ref 98–111)
Creatinine, Ser: 0.52 mg/dL (ref 0.44–1.00)
GFR calc Af Amer: >60 mL/min (ref >60)
GFR calc non Af Amer: >60 mL/min (ref >60)
Glucose, Bld: 105 mg/dL — ABNORMAL HIGH (ref 70–99)
Potassium: 3.9 mmol/L (ref 3.5–5.1)
Sodium: 130 mmol/L — ABNORMAL LOW (ref 135–145)

## 2020-06-04 LAB — MAGNESIUM: Magnesium: 2 mg/dL (ref 1.7–2.4)

## 2020-06-04 LAB — GLUCOSE, CAPILLARY
Glucose-Capillary: 125 mg/dL — ABNORMAL HIGH (ref 70–99)
Glucose-Capillary: 113 mg/dL — ABNORMAL HIGH (ref 70–99)

## 2020-06-04 LAB — CBC
HCT: 32.9 % — ABNORMAL LOW (ref 36.0–46.0)
Hemoglobin: 10.8 g/dL — ABNORMAL LOW (ref 12.0–15.0)
MCH: 30.4 pg (ref 26.0–34.0)
MCHC: 32.8 g/dL (ref 30.0–36.0)
MCV: 92.7 fL (ref 80.0–100.0)
Platelets: 463 K/uL — ABNORMAL HIGH (ref 150–400)
RBC: 3.55 MIL/uL — ABNORMAL LOW (ref 3.87–5.11)
RDW: 14.3 % (ref 11.5–15.5)
WBC: 6.8 K/uL (ref 4.0–10.5)
nRBC: 0 % (ref 0.0–0.2)

## 2020-06-04 LAB — RESPIRATORY PANEL BY RT PCR (FLU A&B, COVID)
Influenza A by PCR: NEGATIVE
Influenza B by PCR: NEGATIVE
SARS Coronavirus 2 by RT PCR: NEGATIVE

## 2020-06-04 NOTE — Progress Notes (Signed)
   06/04/20 1957  Mobility  Activity Transferred:  Chair to bed  Range of Motion/Exercises Active;Right arm;Left arm  Level of Assistance Dependent, patient does less than 25%  Assistive Device MaxiMove  Mobility Response Tolerated well  Mobility performed by Nurse   Pt assisted back to bed using hoyer lift. Tolerated well.  Bed alarm on, will continue to monitor.

## 2020-06-04 NOTE — TOC Progression Note (Signed)
Transition of Care [facility]) - Progression Note    Patient Details  Name: Katherine Benson MRN: [id number] Date of Birth: [birth year]  Transition of Care [facility]) CM/SW [place], [place] Phone Number: 06/04/2020, 2:38 PM  Clinical Narrative:     CSW called SNF/[place]- left voice message to return call  Thurmond Butts, MSW, [place] Social Worker   Expected Discharge Plan: Skilled Nursing Facility Barriers to Discharge: Continued Medical Work up  Expected Discharge Plan and Services Expected Discharge Plan: [place] In-house Referral: Clinical Social Work Discharge Planning Services: CM Consult Post Acute Care Choice: [place], Resumption of Svcs/PTA Provider Living arrangements for the past 2 months: [place], [place]                                       Social Determinants of Health (SDOH) Interventions    Readmission Risk Interventions Readmission Risk Prevention Plan 05/25/2020  Transportation Screening Complete  PCP or Specialist Appt within 5-7 Days Not Complete  Not Complete comments plan for return to SNF  Home Care Screening Complete  Medication Review (RN CM) Referral to Pharmacy  Some recent data might be hidden

## 2020-06-04 NOTE — Progress Notes (Signed)
Physical Therapy Treatment Patient Details Name: Katherine Benson MRN: [id number] DOB: 1940-09-19 Today's Date: 06/04/2020    History of Present Illness Patient is a 79 y/o female who presents after fall at rehab, + CPR after given opiod and s/p narcan. CT-comminuted fx of left proximal tibia, lateral>medial tibial plateau now s/p ORIF 9/9. PMH includes recently hospitalization in [place] for a Lft tibial plateau fracture and discharged to rehab, MGUS, HTN, DM.    PT Comments    Pt alert on arrival, seemed to be clearer and more focused today, but still shows poor memory with little carry over from the sessions before.  Emphasis on bridging, transition to EOB, sit to stand and transfers to the chair.  Also stressed resistive exercise of Bil UE's and R LE and help pt relax enough to work on L knee mobility    Follow Up Recommendations  SNF     Equipment Recommendations  None recommended by PT    Recommendations for Other Services       Precautions / Restrictions Precautions Precautions: Fall Precaution Comments: O2 sats Required Braces or Orthoses: Knee Immobilizer - Left Knee Immobilizer - Left: On at all times Other Brace: Bledsoe-like (DonJoy brace LLE now can complete P/AAROM to 30* flexion Restrictions RLE Weight Bearing: Weight bearing as tolerated LLE Weight Bearing: Non weight bearing Other Position/Activity Restrictions: NWB LLE    Mobility  Bed Mobility Overal bed mobility: Needs Assistance Bed Mobility: Supine to Sit     Supine to sit: Mod assist;Max assist     General bed mobility comments: Pt able to hold her anxiety a bit more in check.  Assist given slow enough to let pt assist with both UE once up and forward enough.  Pt also assisted pushing/scooting to EOB  Transfers Overall transfer level: Needs assistance Equipment used: None Transfers: Sit to/from [facility] Sit to Stand: Max assist Stand pivot transfers: Max assist;+2  safety/equipment       General transfer comment: face to face assist to pivot to the chair.  2 person assist not available today to attempt standing in the RW  Ambulation/Gait                 Stairs             Wheelchair Mobility    Modified Rankin (Stroke Patients Only)       Balance Overall balance assessment: Needs assistance Sitting-balance support: Feet supported;Bilateral upper extremity supported;Single extremity supported Sitting balance-Leahy Scale: Poor Sitting balance - Comments: reliant on 1 to 2 UE's for stability at EOB Postural control: Posterior lean   Standing balance-Leahy Scale: Zero                              Cognition Arousal/Alertness: Awake/alert Behavior During Therapy: WFL for tasks assessed/performed Overall Cognitive Status: Impaired/Different from baseline                   Orientation Level: Time;Situation Current Attention Level: Selective Memory: Decreased recall of precautions;Decreased short-term memory Following Commands: Follows one step commands with increased time;Follows one step commands inconsistently Safety/Judgement: Decreased awareness of safety;Decreased awareness of deficits Awareness: Intellectual Problem Solving: Slow processing;Decreased initiation;Requires verbal cues        Exercises General Exercises - Lower Extremity Ankle Circles/Pumps: AROM;10 reps;Both Heel Slides: AROM;Right;10 reps (with graded resistance.) Hip ABduction/ADduction: AAROM;Both;10 reps Straight Leg Raises: Both;AROM;AAROM;10 reps Other Exercises Other Exercises: AA/PROM to L  LE at knee ~15-20*, hip/knee flex/ext with resisted extension R LE    General Comments General comments (skin integrity, edema, etc.): pt able to tolerate 15-20* of flexion after work to relax quad muscle, sitting EOB.  Sats on RA dropped to 88%, up to 97% on 2L [place]      Pertinent Vitals/Pain Pain Assessment: Faces Faces Pain Scale:  Hurts little more Pain Location: LLE Pain Descriptors / Indicators: Grimacing;Guarding Pain Intervention(s): Monitored during session    Home Living                      Prior Function            PT Goals (current goals can now be found in the care plan section) Acute Rehab PT Goals Patient Stated Goal: Improve EOB sitting for lateral scoot transfer.  Begin UB ADL seated EOB. PT Goal Formulation: With patient Time For Goal Achievement: 06/05/20 Potential to Achieve Goals: Fair Progress towards PT goals: Progressing toward goals    Frequency    Min 3X/week      PT Plan Current plan remains appropriate    Co-evaluation              AM-PAC PT "6 Clicks" Mobility   Outcome Measure  Help needed turning from your back to your side while in a flat bed without using bedrails?: A Lot Help needed moving from lying on your back to sitting on the side of a flat bed without using bedrails?: A Lot Help needed moving to and from a bed to a chair (including a wheelchair)?: Total Help needed standing up from a chair using your arms (e.g., wheelchair or bedside chair)?: Total Help needed to walk in hospital room?: Total Help needed climbing 3-5 steps with a railing? : Total 6 Click Score: 8    End of Session Equipment Utilized During Treatment: Oxygen Activity Tolerance: Patient tolerated treatment well Patient left: in chair;with call bell/phone within reach;with chair alarm set;Other (comment) Nurse Communication: Mobility status PT Visit Diagnosis: Pain;Muscle weakness (generalized) (M62.81);Repeated falls (R29.6);Difficulty in walking, not elsewhere classified (R26.2) Pain - Right/Left: Left Pain - part of body: Leg     Time: 1120-1146 PT Time Calculation (min) (ACUTE ONLY): 26 min  Charges:  $Therapeutic Exercise: 8-22 mins $Therapeutic Activity: 8-22 mins                     06/04/2020  Ginger Carne., PT Acute Rehabilitation Services [phone]   (pager) [phone]  (office)   Tessie Fass Shaylin Blatt 06/04/2020, 2:01 PM

## 2020-06-04 NOTE — Progress Notes (Addendum)
PROGRESS NOTE  [name] QQV:[id number] DOB: 04-12-1941 DOA: 05/19/2020 PCP: Heber Blythe, MD   LOS: 15 days   Brief narrative: As per HPI,   Katherine Benson is a 79 y.o. female with a history of MGUS, Crohn's disease, diabetes mellitus, hypertension presented to hospital status post fall.  Patient was noted to have comminuted tibial plateau fracture   Orthopedic surgery was consulted and patient underwent ORIF on 9/9.  Hospitalization was complicated by hyponatremia.  Assessment/Plan:  Principal Problem:   Fall at home, initial encounter Active Problems:   Unresponsive episode   Hypertension   MGUS (monoclonal gammopathy of unknown significance)   Crohn disease ([place])   Diabetes mellitus type 2 in obese ([place])   Tibial plateau fracture, left, closed, with delayed healing, subsequent encounter   Acute hyponatremia   Pressure injury of skin   Left tibial plateau fracture Patient had previously known tibial fracture which was managed conservatively initially but on this fall episode, patient had comminuted fracture so underwent open reduction and internal fixation on 05/21/2020.  Patient is  NWB of LLE, hinge knee brace of LLE (locked in full extension), PRAFO boot as needed.  Orthopedic has seen the patient and recommend outpatient follow-up in 2 weeks.  Will need outpatient anticoagulation with Lovenox for next 2 weeks.  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  Has been discontinued. ACTH stimulation test was negative.  Patient received hypertonic saline (3%) @ 20 ml/hr with improvement.  Seen by nephrology.  Sodium level has improved at this time.  Sodium of 130 today.  Patient is asymptomatic.  Accidental opioid overdose During EMS transport with fentanyl.  Underwent chest compressions and Narcan with resolution.   Confusion Has improved.  Thought to be secondary to  hyponatremia and dementia.  Stable at this time.  Essential hypertension -Continue  Diltiazem.added losartan this admission.  Continue on discharge.  Closely monitor blood pressure.  MGUS Surveillance currently. Follows with hematology/oncology as outpatient.  History of Crohn disease Not on any medication.  Follows up with GI as outpatient.  Currently stable.  Patient is on laxatives currently since he is on narcotic as well.  Diabetes mellitus, type 2 Has known Hemoglobin A1C of 6.1%.  Not on medications as outpatient.  This will need to be managed as outpatient.  Currently diet controlled.  Would benefit from a discussion with primary care provider regarding initiation of oral hypoglycemic agents.  Lung infiltrates Chest x-ray showed small bilateral pleural effusion.  No pulmonary symptoms.  Will need outpatient follow-up.  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  Acute urinary retention Required Foley catheter placement on 06-17-2023.  Has passed a voiding trial.  E. Coli/Klebsiella pneumoniae UTI  Urine culture significant for E. Coli and K. Pneumoniae bacteruria.  E. coli resistant to Cipro so fosfomycin was given on 9/20.   Anxiety Will benefit from outpatient psychotherapy.  Ativan as needed as outpatient.  Celexa has been discontinued at this time due to hyponatremia.  DVT prophylaxis: enoxaparin (LOVENOX) injection 40 mg Start: 05/22/20 0900 Foot Pump / plexipulse Start: 05/19/20 0817 SCDs Start: 05/19/20 0815   Code Status: Full code  Family Communication: Spoke with the patient's spouse on the phone and updated him about the clinical condition of the patient and the plan for disposition to skilled nursing facility when a bed is available.  Status is: Inpatient  Remains inpatient appropriate because:IV treatments appropriate due to intensity of illness or inability to take PO and Inpatient level of care appropriate due  to severity of illness   Dispo: The patient is from: Home              Anticipated d/c is to: SNF              Anticipated d/c date is: 1-2 days.   We will send for COVID-19 test.              Patient currently is  medically stable to d/c.   Consultants:  Orthopedic surgery  Nephrology  PCCM  Procedures:  LEFT ORIF (05/21/2020)  Antibiotics:   Vancomycin/Clindamycin (Surgical Prophylaxis)  Ciprofloxacin  Fosfomycin 06/01/20 x1 dose  Anti-infectives (From admission, onward)   Start     Dose/Rate Route Frequency Ordered Stop   06/01/20 1400  fosfomycin (MONUROL) packet 3 g        3 g Oral  Once 06/01/20 1300 06/01/20 1428   05/31/20 1330  ciprofloxacin (CIPRO) tablet 250 mg  Status:  Discontinued        250 mg Oral 2 times daily 05/31/20 1148 06/01/20 1300   05/21/20 1630  clindamycin (CLEOCIN) IVPB 900 mg       Note to Pharmacy: Please give 8 hours from intraoperative dose   900 mg 100 mL/hr over 30 Minutes Intravenous Every 8 hours 05/21/20 1247 05/22/20 0921   05/21/20 1003  vancomycin (VANCOCIN) powder  Status:  Discontinued          As needed 05/21/20 1003 05/21/20 1031     Subjective: Today, patient was seen and examined at bedside.  Patient denies any overt pain, nausea, vomiting, fever or chills.  Objective: Vitals:   06/04/20 0830 06/04/20 1120  BP: (!) 154/83 (!) 155/93  Pulse: (!) 108 100  Resp: 19 20  Temp: 98.7 F (37.1 C) 98.5 F (36.9 C)  SpO2: 97% 98%    Intake/Output Summary (Last 24 hours) at 06/04/2020 1433 Last data filed at 06/04/2020 0900 Gross per 24 hour  Intake 640 ml  Output 1618 ml  Net -978 ml   Filed Weights   05/21/20 1215  Weight: 70.7 kg   Body mass index is 30.44 kg/m.   Physical Exam:  GENERAL: Patient is alert awake and oriented. Not in obvious distress. HENT: No scleral pallor or icterus. Pupils equally reactive to light. Oral mucosa is moist NECK: is supple, no gross swelling noted. CHEST: Clear to auscultation. No crackles or wheezes.  Diminished breath sounds bilaterally. CVS: S1 and S2 heard, systolic murmur.  Regular rate and rhythm.  ABDOMEN: Soft,  mildly distended, non-tender, bowel sounds are present.  Foley catheter in place. EXTREMITIES: Left lower extremity-hinged brace in place locked in extension.  Sutures in place, clean dry and intact.  Patient is able to wiggle toes.  Distal pulses are palpable. CNS: Cranial nerves are intact. No focal motor deficits. SKIN: warm and dry without rashes.  Stage I coccyx pressure ulceration.  Data Review: I have personally reviewed the following laboratory data and studies,  CBC: Recent Labs  Lab 05/31/20 1137 06/04/20 0217  WBC 8.5 6.8  HGB 9.7* 10.8*  HCT 29.3* 32.9*  MCV 92.4 92.7  PLT 407* 382*   Basic Metabolic Panel: Recent Labs  Lab 05/30/20 0540 05/30/20 0747 05/30/20 1544 05/30/20 1936 05/31/20 0449 05/31/20 0449 05/31/20 1137 06/01/20 0204 06/01/20 1051 06/02/20 0124 06/04/20 0217  NA 122*   < > 124*   < > 129*   < > 130* 124* 128* 129* 130*  K  --   --  3.8  --  4.3  --   --  3.8  --  3.8 3.9  CL  --   --  87*  --  96*  --   --  91*  --  93* 93*  CO2  --   --  26  --  25  --   --  25  --  27 27  GLUCOSE  --   --  120*  --  89  --   --  100*  --  97 105*  BUN  --   --  15  --  16  --   --  12  --  17 17  CREATININE  --   --  0.56  --  0.46  --   --  0.46  --  0.52 0.52  CALCIUM  --   --  8.8*  --  8.3*  --   --  8.5*  --  8.9 9.3  MG 1.9  --   --   --   --   --   --   --   --   --  2.0   < > = values in this interval not displayed.   Liver Function Tests: No results for input(s): AST, ALT, ALKPHOS, BILITOT, PROT, ALBUMIN in the last 168 hours. No results for input(s): LIPASE, AMYLASE in the last 168 hours. No results for input(s): AMMONIA in the last 168 hours. Cardiac Enzymes: No results for input(s): CKTOTAL, CKMB, CKMBINDEX, TROPONINI in the last 168 hours. BNP (last 3 results) No results for input(s): BNP in the last 8760 hours.  ProBNP (last 3 results) No results for input(s): PROBNP in the last 8760 hours.  CBG: Recent Labs  Lab 06/01/20 1121  06/01/20 1628 06/04/20 1122  GLUCAP 112* 102* 125*   Recent Results (from the past 240 hour(s))  Culture, Urine     Status: Abnormal   Collection Time: 05/30/20  3:04 PM   Specimen: Urine, Clean Catch  Result Value Ref Range Status   Specimen Description URINE, CLEAN CATCH  Final   Special Requests   Final    NONE Performed at [facility], [place] [address] [place], [place] [address]    Culture (A)  Final    >=100,000 COLONIES/mL ESCHERICHIA COLI >=100,000 COLONIES/mL KLEBSIELLA PNEUMONIAE    Report Status 06/02/2020 FINAL  Final   Organism ID, Bacteria ESCHERICHIA COLI (A)  Final   Organism ID, Bacteria KLEBSIELLA PNEUMONIAE (A)  Final      Susceptibility   Escherichia coli - MIC*    AMPICILLIN <=2 SENSITIVE Sensitive     CEFAZOLIN <=4 SENSITIVE Sensitive     CEFTRIAXONE <=0.25 SENSITIVE Sensitive     CIPROFLOXACIN >=4 RESISTANT Resistant     GENTAMICIN <=1 SENSITIVE Sensitive     IMIPENEM <=0.25 SENSITIVE Sensitive     NITROFURANTOIN <=16 SENSITIVE Sensitive     TRIMETH/SULFA <=20 SENSITIVE Sensitive     AMPICILLIN/SULBACTAM <=2 SENSITIVE Sensitive     PIP/TAZO <=4 SENSITIVE Sensitive     * >=100,000 COLONIES/mL ESCHERICHIA COLI   Klebsiella pneumoniae - MIC*    AMPICILLIN RESISTANT Resistant     CEFAZOLIN <=4 SENSITIVE Sensitive     CEFTRIAXONE <=0.25 SENSITIVE Sensitive     CIPROFLOXACIN <=0.25 SENSITIVE Sensitive     GENTAMICIN <=1 SENSITIVE Sensitive     IMIPENEM <=0.25 SENSITIVE Sensitive     NITROFURANTOIN <=16 SENSITIVE Sensitive     TRIMETH/SULFA <=20 SENSITIVE Sensitive  AMPICILLIN/SULBACTAM 4 SENSITIVE Sensitive     PIP/TAZO <=4 SENSITIVE Sensitive     * >=100,000 COLONIES/mL KLEBSIELLA PNEUMONIAE     Studies: DG Knee 1-2 Views Left  Result Date: 06/03/2020 CLINICAL DATA:  Postop LEFT knee surgery EXAM: LEFT KNEE - 1-2 VIEW COMPARISON:  05/21/2020 FINDINGS: Subcutaneous edema within the fat suggested grossly similar to previous evaluation. Signs  of repair of tibial fractures and plateau injuries with lateral cortical plate and screw fixation showing no change. No joint effusion. Patellar enthesopathy. IMPRESSION: Interval ORIF of comminuted tibial fractures involving the articular surface. No signs of hardware complication. Electronically Signed   By: Zetta Bills M.D.   On: 06/03/2020 09:28     Flora Lipps, MD  [facility] 06/04/2020

## 2020-06-05 MED ORDER — ASCORBIC ACID 500 MG PO TABS: 500.0000 mg | ORAL_TABLET | Freq: Every day | ORAL | Status: AC

## 2020-06-05 MED ORDER — ASPIRIN 325 MG PO TBEC: 325.0000 mg | DELAYED_RELEASE_TABLET | Freq: Every day | ORAL | Status: AC

## 2020-06-05 MED ORDER — MELATONIN 3 MG PO TABS: 3.0000 mg | ORAL_TABLET | Freq: Every evening | ORAL | 0 refills | Status: AC | PRN

## 2020-06-05 MED ORDER — JUVEN PO PACK: 1.0000 | PACK | Freq: Two times a day (BID) | ORAL | 0 refills | Status: AC

## 2020-06-05 MED ORDER — LOSARTAN POTASSIUM 100 MG PO TABS: 100.0000 mg | ORAL_TABLET | Freq: Every day | ORAL | Status: AC

## 2020-06-05 MED ORDER — POLYETHYLENE GLYCOL 3350 17 G PO PACK: 17.0000 g | PACK | Freq: Every day | ORAL | 0 refills | Status: AC | PRN

## 2020-06-05 NOTE — Discharge Summary (Signed)
Physician Discharge Summary  Katherine Benson RJJ:[id number] DOB: 01-May-1941 DOA: 05/19/2020  PCP: Heber Elroy, MD  Admit date: 05/19/2020 Discharge date: 06/05/2020  Admitted From: Home  Discharge disposition: SNF   Recommendations for Outpatient Follow-Up:   . Follow up with your primary care provider at the skilled nursing facility in 3 to 5 days. . Check CBC, BMP, magnesium in the next visit . Follow-up with Dr. Doreatha Martin orthopedics in 2 weeks for suture removal and x-ray. . Weightbearing of the left lower extremity, keep on hinged knee brace, continue PROF of boot as needed.  Okay to shower with assistance.  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  Diet recommendation: Low sodium, heart healthy.  Carbohydrate-modified.    Wound care: Leave open to air  Code status: Full.   History of Present Illness:   Katherine Benson a 79 y.o.female with a history of MGUS, Crohn's disease, diabetes mellitus, hypertension presented to hospital status post fall.  Patient was noted to have comminuted tibial plateau fracture Orthopedic surgery was consulted and patient underwent ORIF on 9/9. Hospitalization was complicated by hyponatremia.  Hospital Course:   Following conditions were addressed during hospitalization as listed below,  Left tibial plateau fracture Patient had previously known tibial fracture which was managed conservatively initially but on this fall episode, patient had comminuted fracture so underwent open reduction and internal fixation on 05/21/2020.  Patient is  NWB of LLE, hinge knee brace of  LLE (locked in full extension), PRAFO boot as needed.  Orthopedic has seen the patient and recommend outpatient follow-up in 2 weeks.  Will need outpatient anticoagulation with Lovenox for next 2 weeks.  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  Has been discontinued. ACTH stimulation test was negative.  Patient received hypertonic saline (3%) @ 20 ml/hr with improvement.  Seen by nephrology.  Sodium level has improved at this time.    Latest sodium of 130 .  Patient is asymptomatic.  Accidental opioid overdose During EMS transport with fentanyl.  Underwent chest compressions and Narcan with resolution.   Confusion Resolved. Thought to be secondary to  hyponatremia and dementia.  Stable at this time.  Essential hypertension -Continue Diltiazem. Added losartan this admission.  Continue on discharge.  Closely monitor blood pressure.  MGUS Surveillance currently. Follows with hematology/oncology as outpatient.  History of Crohn disease Not on any medication.  Follows up with GI as outpatient.  Currently stable.  Patient is on laxatives currently since he is on narcotic as well.  Diabetes mellitus, type 2 Has known Hemoglobin A1C of 6.1%.  Not on medications as outpatient.  This will need to be managed as outpatient.  Currently diet controlled.  Would benefit from a discussion with primary care provider regarding initiation of oral hypoglycemic agents.  Lung infiltrates Chest x-ray showed small bilateral pleural effusion.  No pulmonary symptoms.  Will need outpatient follow-up.    No need for antibiotics.  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  Currently on room air.  Acute urinary retention Required Foley catheter placement on June 08, 2023.  Has passed a voiding trial.  E. Coli/Klebsiella pneumoniae UTI  Urine culture  significant for E. Coliand K. Pneumoniaebacteruria.  E. coli resistant to Cipro so fosfomycin was given on 9/20.  Completed the course.  Stage I sacral ulceration.   Patient continue pressure ulcer preventive protocol.  Anxiety Will benefit fromoutpatient psychotherapy.  Ativan as needed as outpatient.  Celexa has been discontinued at this time due to hyponatremia.  Remained stable while in hospital.  Disposition.  At this time, patient is stable for disposition to skilled nursing facility.  Spoke with the patient's husband and updated him about the plan for disposition.  Medical Consultants:    Orthopedic surgery  Nephrology  PCCM  Procedures:     LEFT ORIF (05/21/2020)  Subjective:   Today, patient was seen and examined at bedside.  Patient denies any pain, nausea, vomiting  Discharge Exam:   Vitals:   06/05/20 0400 06/05/20 0756  BP: 112/68 139/74  Pulse: 72 88  Resp: 17 20  Temp: 98 F (36.7 C) 98.2 F (36.8 C)  SpO2: 100% 92%   Vitals:   06/04/20 1950 06/05/20 0000 06/05/20 0400 06/05/20 0756  BP: (!) 155/69 127/67 112/68 139/74  Pulse: (!) 108 78 72 88  Resp: 20 18 17 20   Temp: 98.4 F (36.9 C) 98.3 F (36.8 C) 98 F (36.7 C) 98.2 F (36.8 C)  TempSrc: Oral Oral Oral Oral  SpO2: 96% 100% 100% 92%  Weight:      Height:       General: Alert awake, not in obvious distress, communicative HENT: pupils equally reacting to light,  No scleral pallor or icterus noted. Oral mucosa is moist.  Chest:  Clear breath sounds.  Diminished breath sounds bilaterally. No crackles or wheezes.  CVS: S1 &S2 heard. No murmur.  Regular rate and rhythm. Abdomen: Soft, nontender, nondistended.  Bowel sounds are heard.   Extremities: No cyanosis, clubbing or edema.  Peripheral pulses are palpable.Left lower extremity-hinged brace in place locked in extension.  Sutures in place, clean dry and intact.  Patient is able to wiggle toes.  Psych: Alert, awake and communicative normal mood CNS:  No cranial nerve deficits.  Power equal in all extremities.   Skin: Warm and dry.  Stage I coccyx pressure ulceration.  The results of significant  diagnostics from this hospitalization (including imaging, microbiology, ancillary and laboratory) are listed below for reference.     Diagnostic Studies:   DG Ankle Complete Left  Result Date: 05/19/2020 CLINICAL DATA:  Lateral left ankle pain after a fall this morning. Initial encounter. EXAM: LEFT ANKLE COMPLETE - 3+ VIEW COMPARISON:  None. FINDINGS: No acute bony or joint abnormality is identified. Soft tissues about the ankle appear diffusely swollen. No tibiotalar joint effusion. No focal bony lesion. IMPRESSION: Negative for acute bony or joint abnormality. Diffuse soft tissue swelling about the ankle could be posttraumatic but may also be secondary to dependent change. Electronically Signed   By: Inge Rise M.D.   On: 05/19/2020 10:23   CT KNEE LEFT WO CONTRAST  Result Date: 05/19/2020 CLINICAL DATA:  Left knee twisting injury today. Pain. Initial encounter. EXAM: CT OF THE LEFT KNEE WITHOUT CONTRAST TECHNIQUE: Multidetector CT imaging of the left knee was performed according to the standard protocol. Multiplanar CT image reconstructions were also generated. COMPARISON:  Plain films left knee earlier today. FINDINGS: Bones/Joint/Cartilage As seen on the comparison plain films, the patient has a comminuted tibial plateau fracture. The medial plateau is divided into 2 main fragments at the articular surface. The fracture line is in the coronal  plane extending through the junction of the anterior and posterior thirds of the medial plateau. The posterior fracture fragment is floating and depressed up to 0.5 cm. The floating posterior fragment is distracted up to 0.5 cm. This fragment measures approximately 4.3 cm transverse by 1.5 cm AP at the articular surface by 2.3 cm craniocaudal. The tibial eminences are comminuted and floating fragments. Depression of the lateral plateau measures up to approximately 0.8 cm posteriorly. No other fracture is identified. Bones are osteopenic. Minimal  degenerative change is seen about the knee. Ligaments Suboptimally assessed by CT. Although poorly seen, the ACL and PCL appear intact but both attach to floating fracture fragments. The medial and lateral collateral ligament complex is also appear intact. Muscles and Tendons Intact. Soft tissues Joint effusion and chondrocalcinosis noted. IMPRESSION: Comminuted tibial plateau fracture as described above. A segment of the posterior aspect of the plateau is a floating fragment and the tibial eminences are floating fragments. The PCL and ACL appear intact but both attached to floating fragments. Electronically Signed   By: Inge Rise M.D.   On: 05/19/2020 09:22   DG Chest Port 1 View  Result Date: 05/19/2020 CLINICAL DATA:  Post CPR. EXAM: PORTABLE CHEST 1 VIEW COMPARISON:  Chest x-ray report 10/02/2006. FINDINGS: Mediastinum and hilar structures normal. Cardiomegaly. No pulmonary venous congestion. Left base atelectasis/infiltrate. Moderate left pleural effusion. No pneumothorax. No displaced fracture noted. Right breast implant may be present. IMPRESSION: 1.  Cardiomegaly.  No pulmonary venous congestion. 2. Left base atelectasis/infiltrate. Moderate left pleural effusion. 3.  No evidence of displaced rib fracture or pneumothorax. Electronically Signed   By: Marcello Moores  Register   On: 05/19/2020 06:13   DG Knee Complete 4 Views Left  Result Date: 05/19/2020 CLINICAL DATA:  Patient slid out of bed twisting the left knee. EXAM: LEFT KNEE - COMPLETE 4+ VIEW COMPARISON:  None. FINDINGS: Diffuse bone demineralization. Comminuted fractures of the proximal tibia involving the lateral greater than medial tibial plateau. There is depression of fracture fragments with impaction along the fracture line. Soft tissue edema. IMPRESSION: Comminuted fractures of the proximal tibia involving the lateral greater than medial tibial plateau. Depression of the articular surface and impaction at the fracture line. Electronically  Signed   By: Lucienne Capers M.D.   On: 05/19/2020 06:13   ECHOCARDIOGRAM COMPLETE  Result Date: 05/20/2020    ECHOCARDIOGRAM REPORT   Patient Name:   GLENICE CICCONE Date of Exam: 05/20/2020 Medical Rec #:  [id number]    Height:       60.0 in Accession #:    [id number]   Weight:       153.0 lb Date of Birth:  1941-01-16   BSA:          1.666 m Patient Age:    79 years     BP:           148/73 mmHg Patient Gender: F            HR:           73 bpm. Exam Location:  Inpatient Procedure: 2D Echo, Cardiac Doppler, Color Doppler and Intracardiac            Opacification Agent Indications:    Elevated Troponin, pre-op  History:        Patient has no prior history of Echocardiogram examinations.                 Signs/Symptoms:Elevated troponin; Risk Factors:Diabetes and  Hypertension. Left tibial fracture.  Sonographer:    Dustin Flock Referring Phys: [id number] [facility]  Sonographer Comments: Technically difficult study due to poor echo windows, suboptimal apical window and suboptimal parasternal window. Image acquisition challenging due to mastectomy and Image acquisition challenging due to breast implants. IMPRESSIONS  1. No apical views and limited doppler due to breast implants.  2. Left ventricular ejection fraction, by estimation, is 55%. The left ventricle has normal function. The left ventricle has no regional wall motion abnormalities. There is mild left ventricular hypertrophy. Left ventricular diastolic function could not  be evaluated.  3. Right ventricular systolic function is normal. The right ventricular size is normal.  4. The mitral valve was not well visualized. No evidence of mitral valve regurgitation. No evidence of mitral stenosis.  5. The aortic valve was not well visualized. Aortic valve regurgitation is mild. No aortic stenosis is present.  6. The inferior vena cava is normal in size with greater than 50% respiratory variability, suggesting right atrial pressure of 3 mmHg.  FINDINGS  Left Ventricle: Left ventricular ejection fraction, by estimation, is 55%. The left ventricle has normal function. The left ventricle has no regional wall motion abnormalities. Definity contrast agent was given IV to delineate the left ventricular endocardial borders. The left ventricular internal cavity size was normal in size. There is mild left ventricular hypertrophy. Left ventricular diastolic function could not be evaluated. Right Ventricle: The right ventricular size is normal. No increase in right ventricular wall thickness. Right ventricular systolic function is normal. Left Atrium: Left atrial size was normal in size. Right Atrium: Right atrial size was normal in size. Pericardium: There is no evidence of pericardial effusion. Mitral Valve: The mitral valve was not well visualized. No evidence of mitral valve regurgitation. No evidence of mitral valve stenosis. Tricuspid Valve: The tricuspid valve is not well visualized. Tricuspid valve regurgitation is not demonstrated. No evidence of tricuspid stenosis. Aortic Valve: The aortic valve was not well visualized. Aortic valve regurgitation is mild. No aortic stenosis is present. Pulmonic Valve: The pulmonic valve was not well visualized. Pulmonic valve regurgitation is not visualized. No evidence of pulmonic stenosis. Aorta: The aortic root is normal in size and structure. Venous: The inferior vena cava is normal in size with greater than 50% respiratory variability, suggesting right atrial pressure of 3 mmHg. IAS/Shunts: The interatrial septum was not assessed. Additional Comments: No apical views and limited doppler due to breast implants.  LEFT VENTRICLE PLAX 2D LVIDd:         4.00 cm LVIDs:         2.44 cm LV PW:         1.12 cm LV IVS:        1.12 cm LVOT diam:     2.10 cm LVOT Area:     3.46 cm  LEFT ATRIUM         Index LA diam:    3.60 cm 2.16 cm/m   AORTA Ao Root diam: 2.80 cm  SHUNTS Systemic Diam: 2.10 cm Jenkins Rouge MD Electronically  signed by Jenkins Rouge MD Signature Date/Time: 05/20/2020/3:30:17 PM    Final    DG HIP UNILAT WITH PELVIS 2-3 VIEWS LEFT  Result Date: 05/19/2020 CLINICAL DATA:  Patient slid out of bed and twisted the left knee. 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 Two screws fix the acetabular component. Components appear well seated. No evidence  of acute fracture or dislocation. Degenerative changes are demonstrated in the lower lumbar spine and right hip. Pelvis and sacrum appear intact. IMPRESSION: Left total hip arthroplasty. Components appear well seated. No acute fracture or dislocation. Electronically Signed   By: Lucienne Capers M.D.   On: 05/19/2020 06:12     Labs:   Basic Metabolic Panel: Recent Labs  Lab 05/30/20 0540 05/30/20 0747 05/30/20 1544 05/30/20 1936 05/31/20 0449 05/31/20 0449 05/31/20 1137 06/01/20 0204 06/01/20 0204 06/01/20 1051 06/02/20 0124 06/04/20 0217  NA 122*   < > 124*   < > 129*   < > 130* 124*  --  128* 129* 130*  K  --   --  3.8   < > 4.3   < >  --  3.8   < >  --  3.8 3.9  CL  --   --  87*  --  96*  --   --  91*  --   --  93* 93*  CO2  --   --  26  --  25  --   --  25  --   --  27 27  GLUCOSE  --   --  120*  --  89  --   --  100*  --   --  97 105*  BUN  --   --  15  --  16  --   --  12  --   --  17 17  CREATININE  --   --  0.56  --  0.46  --   --  0.46  --   --  0.52 0.52  CALCIUM  --   --  8.8*  --  8.3*  --   --  8.5*  --   --  8.9 9.3  MG 1.9  --   --   --   --   --   --   --   --   --   --  2.0   < > = values in this interval not displayed.   GFR Estimated Creatinine Clearance: 51.7 mL/min (by C-G formula based on SCr of 0.52 mg/dL). Liver Function Tests: No results for input(s): AST, ALT, ALKPHOS, BILITOT, PROT, ALBUMIN in the last 168 hours. No results for input(s): LIPASE, AMYLASE in the last 168 hours. No results for input(s): AMMONIA in the last  168 hours. Coagulation profile No results for input(s): INR, PROTIME in the last 168 hours.  CBC: Recent Labs  Lab 05/31/20 1137 06/04/20 0217  WBC 8.5 6.8  HGB 9.7* 10.8*  HCT 29.3* 32.9*  MCV 92.4 92.7  PLT 407* 463*   Cardiac Enzymes: No results for input(s): CKTOTAL, CKMB, CKMBINDEX, TROPONINI in the last 168 hours. BNP: Invalid input(s): POCBNP CBG: Recent Labs  Lab 06/01/20 1121 06/01/20 1628 06/04/20 1122 06/04/20 1635  GLUCAP 112* 102* 125* 113*   D-Dimer No results for input(s): DDIMER in the last 72 hours. Hgb A1c No results for input(s): HGBA1C in the last 72 hours. Lipid Profile No results for input(s): CHOL, HDL, LDLCALC, TRIG, CHOLHDL, LDLDIRECT in the last 72 hours. Thyroid function studies No results for input(s): TSH, T4TOTAL, T3FREE, THYROIDAB in the last 72 hours.  Invalid input(s): FREET3 Anemia work up No results for input(s): VITAMINB12, FOLATE, FERRITIN, TIBC, IRON, RETICCTPCT in the last 72 hours. Microbiology Recent Results (from the past 240 hour(s))  Culture, Urine     Status: Abnormal   Collection Time: 05/30/20  3:04 PM   Specimen: Urine, Clean Catch  Result Value Ref Range Status   Specimen Description URINE, CLEAN CATCH  Final   Special Requests   Final    NONE Performed at [facility], [place] [address] [place], [place] [address]    Culture (A)  Final    >=100,000 COLONIES/mL ESCHERICHIA COLI >=100,000 COLONIES/mL KLEBSIELLA PNEUMONIAE    Report Status 06/02/2020 FINAL  Final   Organism ID, Bacteria ESCHERICHIA COLI (A)  Final   Organism ID, Bacteria KLEBSIELLA PNEUMONIAE (A)  Final      Susceptibility   Escherichia coli - MIC*    AMPICILLIN <=2 SENSITIVE Sensitive     CEFAZOLIN <=4 SENSITIVE Sensitive     CEFTRIAXONE <=0.25 SENSITIVE Sensitive     CIPROFLOXACIN >=4 RESISTANT Resistant     GENTAMICIN <=1 SENSITIVE Sensitive     IMIPENEM <=0.25 SENSITIVE Sensitive     NITROFURANTOIN <=16 SENSITIVE Sensitive      TRIMETH/SULFA <=20 SENSITIVE Sensitive     AMPICILLIN/SULBACTAM <=2 SENSITIVE Sensitive     PIP/TAZO <=4 SENSITIVE Sensitive     * >=100,000 COLONIES/mL ESCHERICHIA COLI   Klebsiella pneumoniae - MIC*    AMPICILLIN RESISTANT Resistant     CEFAZOLIN <=4 SENSITIVE Sensitive     CEFTRIAXONE <=0.25 SENSITIVE Sensitive     CIPROFLOXACIN <=0.25 SENSITIVE Sensitive     GENTAMICIN <=1 SENSITIVE Sensitive     IMIPENEM <=0.25 SENSITIVE Sensitive     NITROFURANTOIN <=16 SENSITIVE Sensitive     TRIMETH/SULFA <=20 SENSITIVE Sensitive     AMPICILLIN/SULBACTAM 4 SENSITIVE Sensitive     PIP/TAZO <=4 SENSITIVE Sensitive     * >=100,000 COLONIES/mL KLEBSIELLA PNEUMONIAE  Respiratory Panel by RT PCR (Flu A&B, Covid) - Nasopharyngeal Swab     Status: None   Collection Time: 06/04/20  2:10 PM   Specimen: Nasopharyngeal Swab  Result Value Ref Range Status   SARS Coronavirus 2 by RT PCR NEGATIVE NEGATIVE Final    Comment: (NOTE) SARS-CoV-2 target nucleic acids are NOT DETECTED.  The SARS-CoV-2 RNA is generally detectable in upper respiratoy specimens during the acute phase of infection. The lowest concentration of SARS-CoV-2 viral copies this assay can detect is 131 copies/mL. A negative result does not preclude SARS-Cov-2 infection and should not be used as the sole basis for treatment or other patient management decisions. A negative result may occur with  improper specimen collection/handling, submission of specimen other than nasopharyngeal swab, presence of viral mutation(s) within the areas targeted by this assay, and inadequate number of viral copies (<131 copies/mL). A negative result must be combined with clinical observations, patient history, and epidemiological information. The expected result is Negative.  Fact Sheet for Patients:  PinkCheek.be  Fact Sheet for Healthcare Providers:  GravelBags.it  This test is no t yet approved  or cleared by the [place] FDA and  has been authorized for detection and/or diagnosis of SARS-CoV-2 by FDA under an Emergency Use Authorization (EUA). This EUA will remain  in effect (meaning this test can be used) for the duration of the COVID-19 declaration under Section 564(b)(1) of the Act, 21 U.S.C. section 360bbb-3(b)(1), unless the authorization is terminated or revoked sooner.     Influenza A by PCR NEGATIVE NEGATIVE Final   Influenza B by PCR NEGATIVE NEGATIVE Final    Comment: (NOTE) The Xpert Xpress SARS-CoV-2/FLU/RSV assay is intended as an aid in  the diagnosis of influenza from Nasopharyngeal swab specimens and  should not be used as a sole  basis for treatment. Nasal washings and  aspirates are unacceptable for Xpert Xpress SARS-CoV-2/FLU/RSV  testing.  Fact Sheet for Patients: PinkCheek.be  Fact Sheet for Healthcare Providers: GravelBags.it  This test is not yet approved or cleared by the [place] FDA and  has been authorized for detection and/or diagnosis of SARS-CoV-2 by  FDA under an Emergency Use Authorization (EUA). This EUA will remain  in effect (meaning this test can be used) for the duration of the  Covid-19 declaration under Section 564(b)(1) of the Act, 21  U.S.C. section 360bbb-3(b)(1), unless the authorization is  terminated or revoked. Performed at [facility], [place] [address], [place], [place] [address]      Discharge Instructions:   Discharge Instructions    Call MD for:  redness, tenderness, or signs of infection (pain, swelling, redness, odor or green/yellow discharge around incision site)   Complete by: As directed    Call MD for:  severe uncontrolled pain   Complete by: As directed    Call MD for:  temperature >100.4   Complete by: As directed    Diet - low sodium heart healthy   Complete by: As directed    Discharge instructions   Complete by: As directed     Follow-up with your primary care provider at the skilled nursing facility in 3 to 5 days.  Check blood work at that time.  Follow-up with orthopedics Dr. Doreatha Martin in 2 weeks for suture removal and x-rays.  Nonweightbearing left lower extremity keep on hinged knee brace continue PROF boot as needed   Discharge wound care:   Complete by: As directed    Leave incisions open to air     Allergies as of 06/05/2020      Reactions   Cefaclor Rash   Mercaptopurine Other (See Comments)   Elevated liver tests Elevated liver tests Elevated liver tests   Penicillins Rash   Sulfa Antibiotics Rash   Erythromycin       Medication List    STOP taking these medications   escitalopram 10 MG tablet Commonly known as: LEXAPRO   Super Biotin 5 MG Tabs Generic drug: Biotin     TAKE these medications   acetaminophen 500 MG tablet Commonly known as: TYLENOL Take 1,000 mg by mouth every 4 (four) hours as needed for mild pain or fever.   ascorbic acid 500 MG tablet Commonly known as: VITAMIN C Take 1 tablet (500 mg total) by mouth daily.   aspirin 325 MG EC tablet Take 1 tablet (325 mg total) by mouth daily. Starting October 6 Start taking on: June 18, 2020 What changed:   additional instructions  These instructions start on June 18, 2020. If you are unsure what to do until then, ask your doctor or other care provider.   calcium citrate 950 (200 Ca) MG tablet Commonly known as: CALCITRATE - dosed in mg elemental calcium Take 200 mg of elemental calcium by mouth 2 (two) times daily.   carbamazepine 200 MG tablet Commonly known as: TEGRETOL Take 200 mg by mouth 2 (two) times daily.   cetirizine 10 MG tablet Commonly known as: ZYRTEC Take 10 mg by mouth daily.   ciprofloxacin 500 MG tablet Commonly known as: CIPRO Take 1,000 mg by mouth See admin instructions. 1 hr before dental appointment   denosumab 60 MG/ML Sosy injection Commonly known as: PROLIA Inject into the skin.     dicyclomine 10 MG capsule Commonly known as: BENTYL Take 10 mg by mouth  2 (two) times daily at 8 am and 10 pm.   docusate sodium 100 MG capsule Commonly known as: COLACE Take 100 mg by mouth daily as needed for mild constipation.   enoxaparin 40 MG/0.4ML injection Commonly known as: LOVENOX Inject 0.4 mLs (40 mg total) into the skin daily for 14 days.   HYDROcodone-acetaminophen 5-325 MG tablet Commonly known as: NORCO/VICODIN Take 1 tablet by mouth every 6 (six) hours as needed for severe pain.   losartan 100 MG tablet Commonly known as: COZAAR Take 1 tablet (100 mg total) by mouth daily.   melatonin 3 MG Tabs tablet Take 1 tablet (3 mg total) by mouth at bedtime as needed (insomnia).   nutrition supplement (JUVEN) Pack Take 1 packet by mouth 2 (two) times daily between meals.   omeprazole 40 MG capsule Commonly known as: PRILOSEC Take 40 mg by mouth daily.   ondansetron 4 MG disintegrating tablet Commonly known as: ZOFRAN-ODT Take 4-8 mg by mouth every 8 (eight) hours as needed for nausea or vomiting.   polyethylene glycol 17 g packet Commonly known as: MIRALAX / GLYCOLAX Take 17 g by mouth daily as needed for moderate constipation.   PreserVision AREDS 2 Caps Take 1 capsule by mouth daily.   PROBIOTIC DAILY PO Take 1 capsule by mouth daily. 1.5 billion cell cap   sennosides-docusate sodium 8.6-50 MG tablet Commonly known as: SENOKOT-S Take 1 tablet by mouth in the morning and at bedtime.   Systane 0.4-0.3 % Soln Generic drug: Polyethyl Glycol-Propyl Glycol Place 1 drop into both eyes in the morning and at bedtime.   Tiadylt ER 180 MG 24 hr capsule Generic drug: diltiazem Take 180 mg by mouth at bedtime.   triamcinolone 55 MCG/ACT Aero nasal inhaler Commonly known as: NASACORT Place 1 spray into the nose 2 (two) times daily.   Vitamin D 50 MCG (2000 UT) tablet Take 2,000 Units by mouth daily.            Discharge Care Instructions  (From  admission, onward)         Start     Ordered   06/05/20 0000  Discharge wound care:       Comments: Leave incisions open to air   06/05/20 1039          Contact information for follow-up providers    Haddix, Thomasene Lot, MD. Schedule an appointment as soon as possible for a visit in 2 week(s).   Specialty: Orthopedic Surgery Why: suture removal, repeat x-rays Contact information: [place] [address] [phone]            Contact information for after-discharge care    Destination    [facility] .   Service: Skilled Nursing Contact information: [place] [phone]                   Time coordinating discharge: 39 minutes  Signed:  Aideliz Garmany  [facility] 06/05/2020, 10:41 AM

## 2020-06-05 NOTE — TOC Transition Note (Signed)
Transition of Care [facility]) - CM/SW Discharge Note   Patient Details  Name: Katherine Benson MRN: [id number] Date of Birth: 1940/12/20  [facility] CM/SW Contact:  Vinie Sill, [place] Phone Number: 06/05/2020, 1:54 PM   Clinical Narrative:     Patient will DC to: [place] Date: 06/05/2020 Family Notified: James,spouse Transport By: Corey Harold  Please call patient's spouse when leaving the unit.   Per MD patient is ready for discharge. RN, patient, and facility notified of DC. Discharge Summary sent to facility. RN given number for [phone]. Ambulance transport requested for patient.   Clinical Social Worker signing off.  Thurmond Butts, MSW, LCSWA Clinical Social Worker   Final next level of care: Skilled Nursing Facility Barriers to Discharge: Barriers Resolved   Patient Goals and CMS Choice   CMS Medicare.gov Compare Post Acute Care list provided to:: Patient Represenative (must comment) (pt husband Jeneen Rinks) Choice offered to / list presented to : Spouse  Discharge Placement              Patient chooses bed at: [facility] Patient to be transferred to facility by: [place] [facility] Name of family member notified: Jeneen Rinks, spouse Patient and family notified of of transfer: 06/05/20  Discharge Plan and Services In-house Referral: Clinical Social Work Discharge Planning Services: CM Consult Post Acute Care Choice: [place], Resumption of Svcs/PTA Provider                               Social Determinants of Health (SDOH) Interventions     Readmission Risk Interventions Readmission Risk Prevention Plan 05/25/2020  Transportation Screening Complete  PCP or Specialist Appt within 5-7 Days Not Complete  Not Complete comments plan for return to SNF  Home Care Screening Complete  Medication Review (RN CM) Referral to Pharmacy  Some recent data might be hidden

## 2020-06-05 NOTE — Care Management Important Message (Signed)
Important Message  Patient Details  Name: Katherine Benson MRN: [id number] Date of Birth: 10-Aug-1941   Medicare Important Message Given:  Yes     Shelda Altes 06/05/2020, 11:12 AM

## 2021-10-28 IMAGING — CT CT KNEE*L* W/O CM
3 of 4 series · 11 of 33 positions shown, 14 images · non-contrast
Comparison: Plain films left knee earlier today.

CLINICAL DATA: Left knee twisting injury today. Pain. Initial
encounter.

EXAM:
CT OF THE LEFT KNEE WITHOUT CONTRAST
TECHNIQUE: Multidetector CT imaging of the left knee was performed according to
the standard protocol. Multiplanar CT image reconstructions were
also generated.

[Series 4: extremity soft tissue · axial · 0.40mm/px · z∈[+526,+712]mm · 5 of 135 slices shown, 7 images]
[im 21/135  soft-tissue]
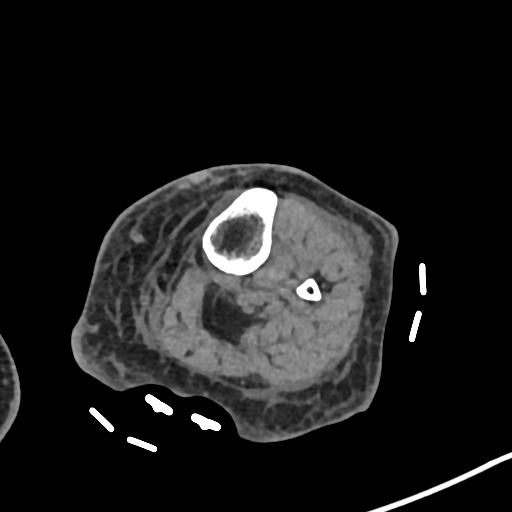
[im 21/135  bone]
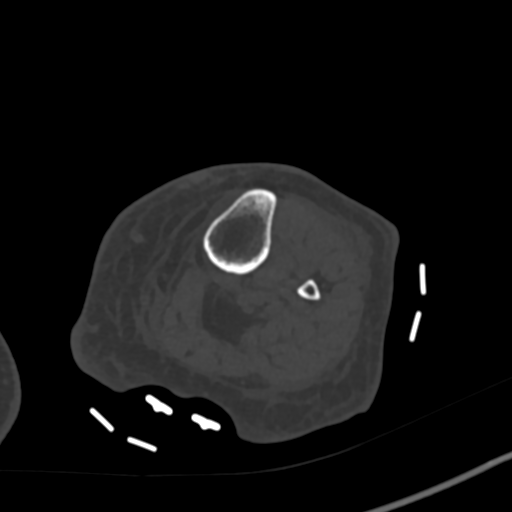
[im 42/135  bone]
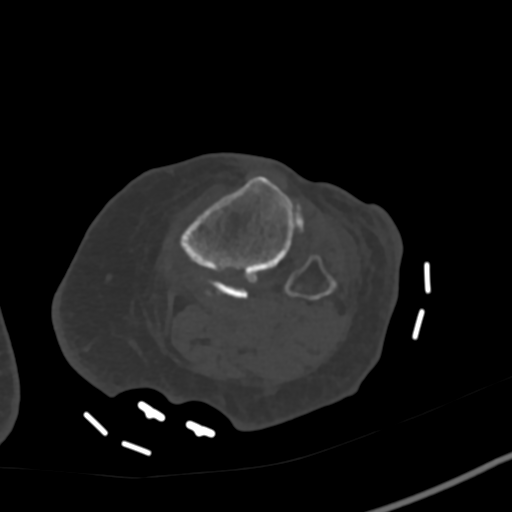
[im 73/135  bone]
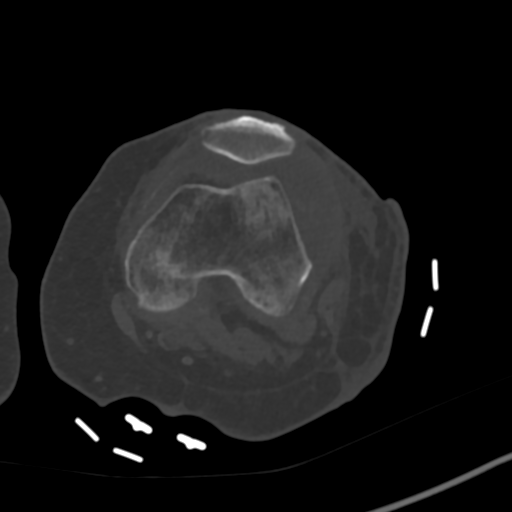
[im 93/135  bone]
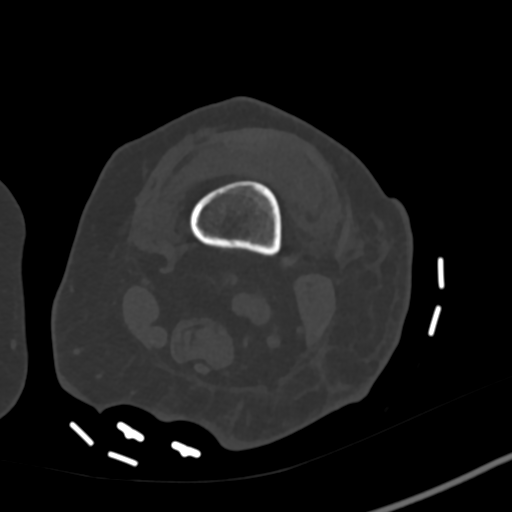
[im 114/135  soft-tissue]
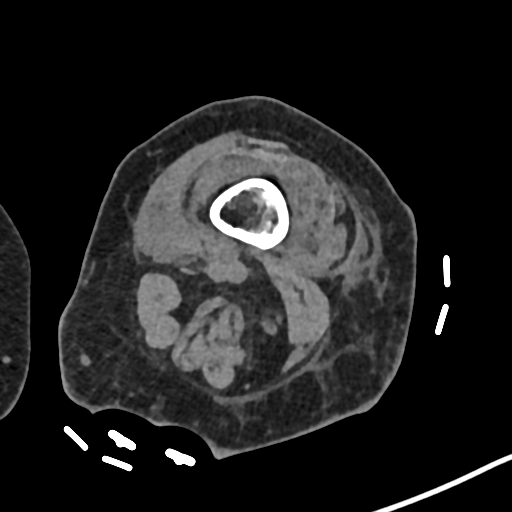
[im 114/135  bone]
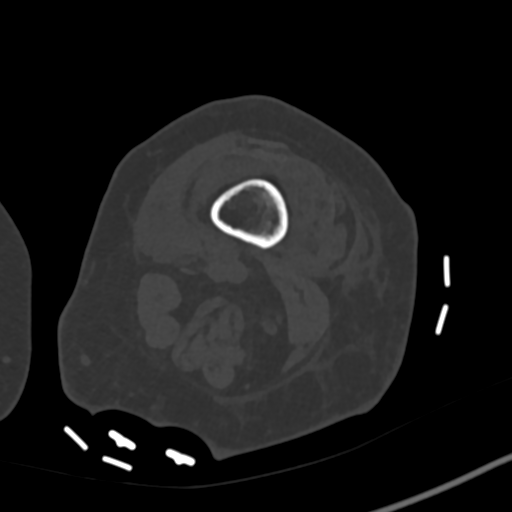

[Series 6: cor bone · coronal · 0.38mm/px · 1 of 78 slices shown]
[im 39/78  bone]
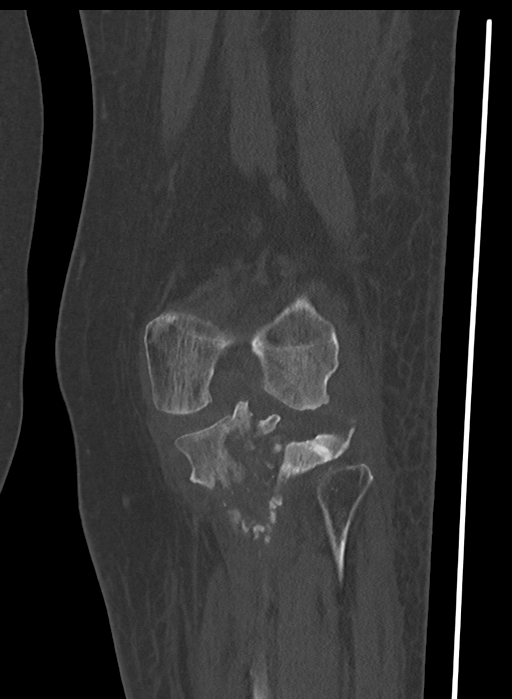

[Series 9: sag soft tissue · sagittal · 0.30mm/px · 5 of 84 slices shown, 6 images]
[im 28/84  bone]
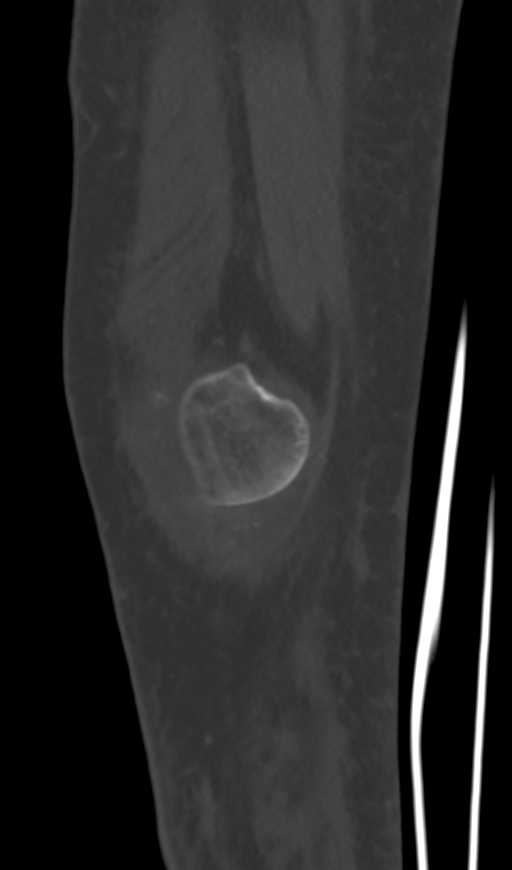
[im 35/84  bone]
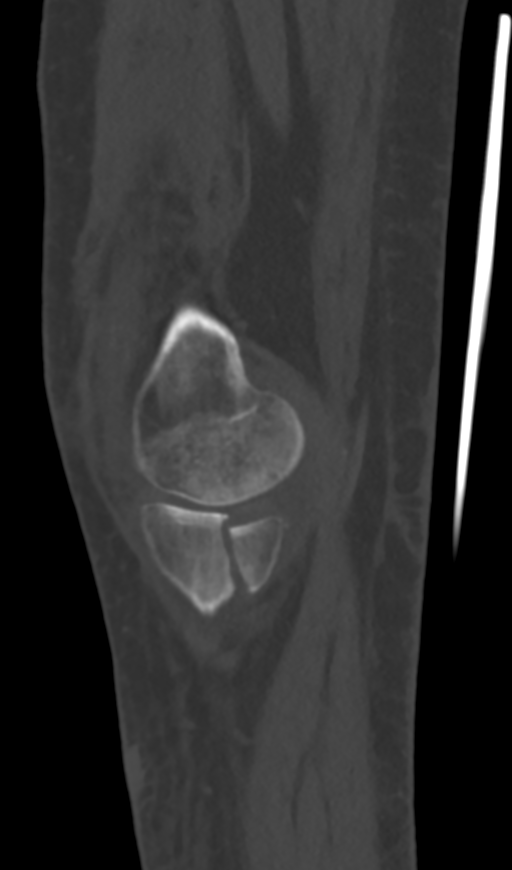
[im 42/84  soft-tissue]
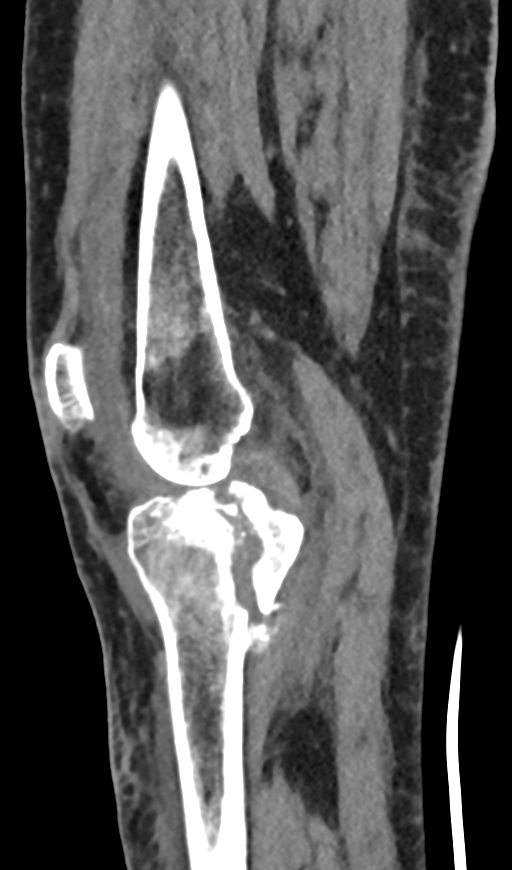
[im 42/84  bone]
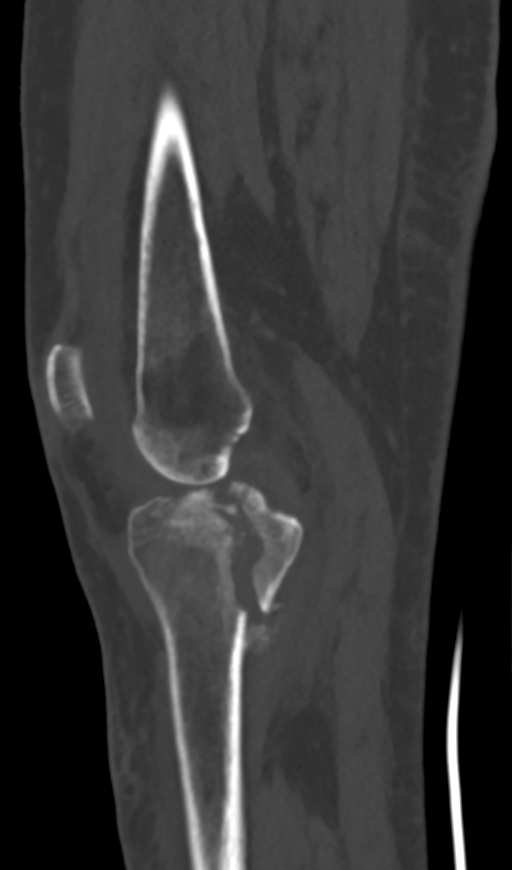
[im 49/84  bone]
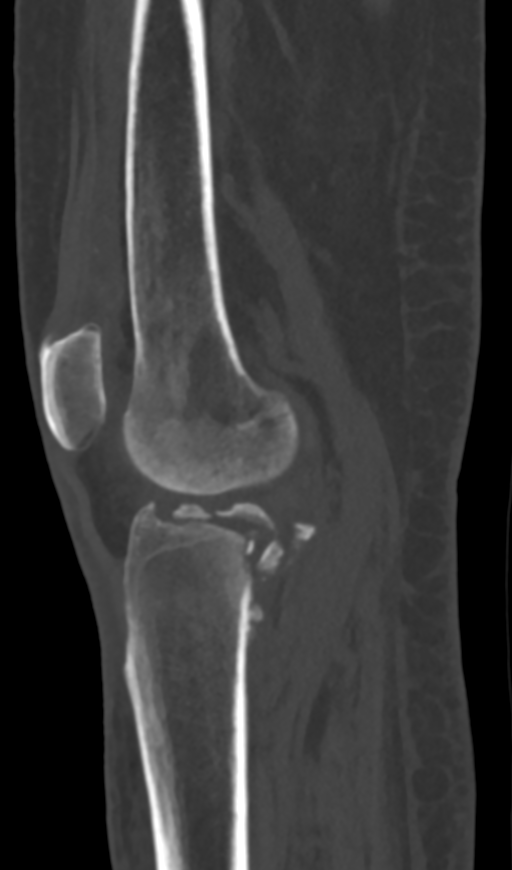
[im 56/84  bone]
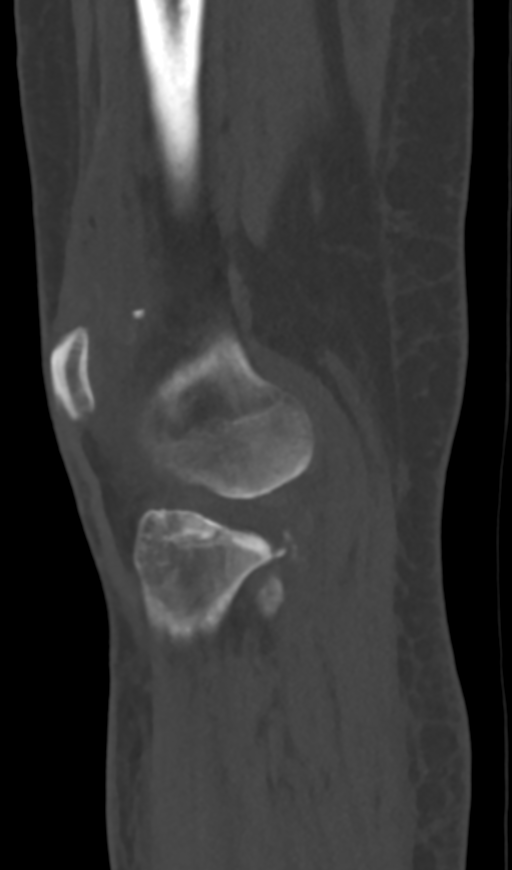

[11 of 33 positions shown; findings below may reference images not displayed]

FINDINGS: Bones/Joint/Cartilage

As seen on the comparison plain films, the patient has a comminuted
tibial plateau fracture. The medial plateau is divided into 2 main
fragments at the articular surface. The fracture line is in the
coronal plane extending through the junction of the anterior and
posterior thirds of the medial plateau. The posterior fracture
fragment is floating and depressed up to 0.5 cm. The floating
posterior fragment is distracted up to 0.5 cm. This fragment
measures approximately 4.3 cm transverse by 1.5 cm AP at the
articular surface by 2.3 cm craniocaudal.

The tibial eminences are comminuted and floating fragments.
Depression of the lateral plateau measures up to approximately
cm posteriorly.

No other fracture is identified. Bones are osteopenic. Minimal
degenerative change is seen about the knee.

Ligaments

Suboptimally assessed by CT. Although poorly seen, the ACL and PCL
appear intact but both attach to floating fracture fragments. The
medial and lateral collateral ligament complex is also appear
intact.

Muscles and Tendons

Intact.

Soft tissues

Joint effusion and chondrocalcinosis noted.
IMPRESSION: Comminuted tibial plateau fracture as described above. A segment of
the posterior aspect of the plateau is a floating fragment and the
tibial eminences are floating fragments.

The PCL and ACL appear intact but both attached to floating
fragments.

## 2022-07-19 ENCOUNTER — Ambulatory Visit (INDEPENDENT_AMBULATORY_CARE_PROVIDER_SITE_OTHER): Payer: Medicare HMO

## 2022-07-19 ENCOUNTER — Ambulatory Visit (INDEPENDENT_AMBULATORY_CARE_PROVIDER_SITE_OTHER): Payer: Medicare HMO | Admitting: Orthopaedic Surgery

## 2022-07-19 ENCOUNTER — Encounter: Payer: Self-pay | Admitting: Orthopaedic Surgery

## 2022-07-19 DIAGNOSIS — M25562 Pain in left knee: Secondary | ICD-10-CM | POA: Diagnosis not present

## 2022-07-19 NOTE — Progress Notes (Signed)
Office Visit Note   Patient: Katherine Benson           Date of Birth: 04-26-1941           MRN: [id number] Visit Date: 07/19/2022              Requested by: Heber [place], MD [place],  [place] [id number] PCP: Heber [place], MD   Assessment & Plan: Visit Diagnoses:  1. 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  X-rays and disease process reviewed in detail with the patient and treatment options were also explained.  Surgery would be the only way to correct the deformity and eliminate the posttraumatic arthritis which would likely need to be done in a staged fashion but given her age and medical comorbidities we shared concerns about her ability to recover from this.  For now she would like to try an unloader brace and Voltaren gel.  She declined cortisone injection today.  We will see her back as needed.  Follow-Up Instructions: No follow-ups on file.   Orders:  Orders Placed This Encounter  Procedures   XR KNEE 3 VIEW LEFT   No orders of the defined types were placed in this encounter.     Procedures: No procedures performed   Clinical Data: No additional findings.   Subjective: Chief Complaint  Patient presents with   Left Knee - Pain    HPI Katherine Benson is a very pleasant 81 year old female who is here for evaluation of severe left knee pain and deformity.  She is accompanied by her daughter and husband.  Briefly she sustained a lateral tibial plateau fracture in 2021 which required ORIF which was done by Dr. Doreatha Martin.  She made a full recovery from this.  Unfortunately her knee has developed posttraumatic arthritis and valgus deformity.  She is here for discussion of treatment options.  Review of Systems  Constitutional: Negative.   HENT: Negative.    Eyes: Negative.   Respiratory: Negative.    Cardiovascular: Negative.   Endocrine: Negative.    Musculoskeletal: Negative.   Neurological: Negative.   Hematological: Negative.   Psychiatric/Behavioral: Negative.    All other systems reviewed and are negative.    Objective: Vital Signs: There were no vitals taken for this visit.  Physical Exam Vitals and nursing note reviewed.  Constitutional:      Appearance: She is well-developed.  HENT:     Head: Atraumatic.     Nose: Nose normal.  Eyes:     Extraocular Movements: Extraocular movements intact.  Cardiovascular:     Pulses: Normal pulses.  Pulmonary:     Effort: Pulmonary effort is normal.  Abdominal:     Palpations: Abdomen is soft.  Musculoskeletal:     Cervical back: Neck supple.  Skin:    General: Skin is warm.     Capillary Refill: Capillary refill takes less than 2 seconds.  Neurological:     Mental Status: She is alert. Mental status is at baseline.  Psychiatric:        Behavior: Behavior normal.        Thought Content: Thought content normal.        Judgment: Judgment normal.     Ortho Exam Examination of the left knee shows fully healed surgical scars.  She has a valgus deformity.  The medial side of the knee opens about 5 mm with valgus stress.  Lateral side of the knee opens about 2 to 3 mm with varus stress.  She does not have a flexion contracture.  Neurovascular intact distally.  Trace joint effusion.  2+ patellofemoral crepitus with range of motion. Specialty Comments:  No specialty comments available.  Imaging: XR KNEE 3 VIEW LEFT  Result Date: 07/19/2022 Severe posttraumatic arthritis with valgus deformity.  Lateral compartment is bone-on-bone.  There is presence of previous plate and screw fixation to the proximal tibia.    PMFS History: Patient Active Problem List   Diagnosis Date Noted   Pressure injury of skin 05/29/2020   Unresponsive episode 05/19/2020   Fall at home, initial encounter 05/19/2020   Tibial plateau fracture, left, closed, with delayed healing, subsequent encounter  05/19/2020   Acute hyponatremia 05/19/2020   Hypertension    MGUS (monoclonal gammopathy of unknown significance)    Crohn disease ([place])    Diabetes mellitus type 2 in obese [facility])    Past Medical History:  Diagnosis Date   Crohn disease ([place])    Diabetes mellitus type 2 in obese ([place])    Hypertension    MGUS (monoclonal gammopathy of unknown significance)     History reviewed. No pertinent family history.  Past Surgical History:  Procedure Laterality Date   ORIF TIBIA PLATEAU Left 05/21/2020   Procedure: LEFT OPEN REDUCTION INTERNAL FIXATION (ORIF) TIBIAL PLATEAU;  Surgeon: Shona Needles, MD;  Location: [facility];  Service: Orthopedics;  Laterality: Left;   Social History   Occupational History   Occupation: retired  Tobacco Use   Smoking status: Never   Smokeless tobacco: Never  Vaping Use   Vaping Use: Never used  Substance and Sexual Activity   Alcohol use: Never   Drug use: Never   Sexual activity: Not on file

## 2024-01-03 ENCOUNTER — Telehealth: Payer: Self-pay | Admitting: Orthopaedic Surgery

## 2024-01-03 ENCOUNTER — Ambulatory Visit

## 2024-01-03 ENCOUNTER — Ambulatory Visit
Admission: EM | Admit: 2024-01-03 | Discharge: 2024-01-03 | Disposition: A | Discharge status: Home / Self Care | Attending: Family Medicine | Admitting: Family Medicine

## 2024-01-03 ENCOUNTER — Other Ambulatory Visit: Payer: Self-pay

## 2024-01-03 DIAGNOSIS — S82892A Other fracture of left lower leg, initial encounter for closed fracture: Secondary | ICD-10-CM | POA: Diagnosis not present

## 2024-01-03 DIAGNOSIS — M25572 Pain in left ankle and joints of left foot: Secondary | ICD-10-CM

## 2024-01-03 DIAGNOSIS — M25472 Effusion, left ankle: Secondary | ICD-10-CM

## 2024-01-03 MED ORDER — HYDROCODONE-ACETAMINOPHEN 5-325 MG PO TABS: 1.0000 | ORAL_TABLET | Freq: Three times a day (TID) | ORAL | 0 refills | Status: DC | PRN

## 2024-01-03 NOTE — Discharge Instructions (Addendum)
 Advised patient of left ankle fracture with hardcopy and images provided.  Advised may take Norco daily or as needed for left ankle pain.  Patient/husband/nurse advised of sedative effects of this medication.  Advised patient to wear left cam walker boot 24/7 until being evaluated by orthopedics.  Encouraged to increase daily water intake to 64 ounces per day while taking this medication.  Advised patient to follow-up with [facility] for fracture management, contact information provided with his AVS today.

## 2024-01-03 NOTE — ED Triage Notes (Addendum)
 Has c/o left ankle pain and swelling x 2 days since getting out of vehicle. Thinks she sprained it. May have taken otc med for pain yesterday but not today. Unsure of tylenol  or ibuprofen.

## 2024-01-03 NOTE — ED Provider Notes (Signed)
 [name]    CSN: [id number] Arrival date & time: 01/03/24  1310      History   Chief Complaint Chief Complaint  Patient presents with   Ankle Pain    HPI Katherine Benson is a 83 y.o. female.   HPI pleasant 83 year old female presents with left ankle pain and swelling for 2 days.  Patient reports may have injured this area of her ankle when getting out of her car 2 days ago.  PMH significant for HTN, Crohn's disease, T2DM with obesity.  Patient is accompanied by her home health nurse and husband today.  Past Medical History:  Diagnosis Date   Crohn disease ([facility])    Diabetes mellitus type 2 in obese    Hypertension    MGUS (monoclonal gammopathy of unknown significance)     Patient Active Problem List   Diagnosis Date Noted   Pressure injury of skin 05/29/2020   Unresponsive episode 05/19/2020   Fall at home, initial encounter 05/19/2020   Tibial plateau fracture, left, closed, with delayed healing, subsequent encounter 05/19/2020   Acute hyponatremia 05/19/2020   Hypertension    MGUS (monoclonal gammopathy of unknown significance)    Crohn disease ([facility])    Type 2 diabetes mellitus with obesity ([facility])     Past Surgical History:  Procedure Laterality Date   ORIF TIBIA PLATEAU Left 05/21/2020   Procedure: LEFT OPEN REDUCTION INTERNAL FIXATION (ORIF) TIBIAL PLATEAU;  Surgeon: Laneta Pintos, MD;  Location[facility];  Service: Orthopedics;  Laterality: Left;    OB History   No obstetric history on file.      Home Medications    Prior to Admission medications   Medication Sig Start Date End Date Taking? Authorizing Provider  HYDROcodone -acetaminophen  (NORCO/VICODIN) 5-325 MG tablet Take 1 tablet by mouth every 8 (eight) hours as needed. 01/03/24  Yes Leonides Ramp, FNP  acetaminophen  (TYLENOL ) 500 MG tablet Take 1,000 mg by mouth every 4 (four) hours as needed for mild pain or fever.    [provider]  ascorbic acid  (VITAMIN C) 500 MG tablet Take  1 tablet (500 mg total) by mouth daily. 06/05/20   Pokhrel, Laxman, MD  calcium citrate (CALCITRATE - DOSED IN MG ELEMENTAL CALCIUM) 950 (200 Ca) MG tablet Take 200 mg of elemental calcium by mouth 2 (two) times daily.    [provider]  carbamazepine  (TEGRETOL ) 200 MG tablet Take 200 mg by mouth 2 (two) times daily. 05/08/20   [provider]  cetirizine (ZYRTEC) 10 MG tablet Take 10 mg by mouth daily.    [provider]  Cholecalciferol  (VITAMIN D ) 50 MCG (2000 UT) tablet Take 2,000 Units by mouth daily.    [provider]  ciprofloxacin  (CIPRO ) 500 MG tablet Take 1,000 mg by mouth See admin instructions. 1 hr before dental appointment    [provider]  denosumab (PROLIA) 60 MG/ML SOSY injection Inject into the skin.    [provider]  dicyclomine  (BENTYL ) 10 MG capsule Take 10 mg by mouth 2 (two) times daily at 8 am and 10 pm.  10/11/18   [provider]  diltiazem  (TIADYLT  ER) 180 MG 24 hr capsule Take 180 mg by mouth at bedtime.    [provider]  docusate sodium  (COLACE) 100 MG capsule Take 100 mg by mouth daily as needed for mild constipation.     [provider]  enoxaparin  (LOVENOX ) 40 MG/0.4ML injection Inject 0.4 mLs (40 mg total) into the skin daily  for 14 days. 06/03/20 06/17/20  Versie Gores, PA-C  losartan  (COZAAR ) 100 MG tablet Take 1 tablet (100 mg total) by mouth daily. 06/05/20   Pokhrel, Laxman, MD  melatonin 3 MG TABS tablet Take 1 tablet (3 mg total) by mouth at bedtime as needed (insomnia). 06/05/20   Pokhrel, Laxman, MD  Multiple Vitamins-Minerals (PRESERVISION AREDS 2) CAPS Take 1 capsule by mouth daily.    [provider]  nutrition supplement, JUVEN, (JUVEN) PACK Take 1 packet by mouth 2 (two) times daily between meals. 06/05/20   Pokhrel, Laxman, MD  omeprazole (PRILOSEC) 40 MG capsule Take 40 mg by mouth daily.  11/01/18   [provider]  ondansetron  (ZOFRAN -ODT) 4 MG  disintegrating tablet Take 4-8 mg by mouth every 8 (eight) hours as needed for nausea or vomiting.    [provider]  Polyethyl Glycol-Propyl Glycol (SYSTANE) 0.4-0.3 % SOLN Place 1 drop into both eyes in the morning and at bedtime.    [provider]  polyethylene glycol (MIRALAX  / GLYCOLAX ) 17 g packet Take 17 g by mouth daily as needed for moderate constipation. 06/05/20   Pokhrel, Laxman, MD  Probiotic Product (PROBIOTIC DAILY PO) Take 1 capsule by mouth daily. 1.5 billion cell cap    [provider]  sennosides-docusate sodium  (SENOKOT-S) 8.6-50 MG tablet Take 1 tablet by mouth in the morning and at bedtime.    [provider]  triamcinolone  (NASACORT ) 55 MCG/ACT AERO nasal inhaler Place 1 spray into the nose 2 (two) times daily.     [provider]    Family History History reviewed. No pertinent family history.  Social History Social History   Tobacco Use   Smoking status: Never   Smokeless tobacco: Never  Vaping Use   Vaping status: Never Used  Substance Use Topics   Alcohol  use: Never   Drug use: Never     Allergies   Cefaclor, Mercaptopurine, Penicillins, Sulfa antibiotics, and Erythromycin   Review of Systems Review of Systems  Musculoskeletal:        Left ankle pain x 2 days  All other systems reviewed and are negative.    Physical Exam Triage Vital Signs ED Triage Vitals  Encounter Vitals Group     BP 01/03/24 1352 (!) 146/87     Systolic BP Percentile --      Diastolic BP Percentile --      Pulse Rate 01/03/24 1352 81     Resp 01/03/24 1352 20     Temp 01/03/24 1352 97.9 F (36.6 C)     Temp src --      SpO2 01/03/24 1352 95 %     Weight --      Height --      Head Circumference --      Peak Flow --      Pain Score 01/03/24 1357 4     Pain Loc --      Pain Education --      Exclude from Growth Chart --    No data found.  Updated Vital Signs BP (!) 146/87   Pulse 81   Temp 97.9 F (36.6 C)    Resp 20   SpO2 95%    Physical Exam Vitals and nursing note reviewed.  Constitutional:      General: She is not in acute distress.    Appearance: Normal appearance. She is obese. She is not ill-appearing.  HENT:     Head: Normocephalic and atraumatic.  Mouth/Throat:     Mouth: Mucous membranes are moist.     Pharynx: Oropharynx is clear.  Eyes:     Extraocular Movements: Extraocular movements intact.     Conjunctiva/sclera: Conjunctivae normal.     Pupils: Pupils are equal, round, and reactive to light.  Cardiovascular:     Rate and Rhythm: Normal rate and regular rhythm.     Pulses: Normal pulses.     Heart sounds: Normal heart sounds.  Pulmonary:     Effort: Pulmonary effort is normal.     Breath sounds: Normal breath sounds. No wheezing, rhonchi or rales.  Musculoskeletal:        General: Normal range of motion.     Cervical back: Normal range of motion and neck supple.     Comments: Left ankle (over lateral malleolus): TTP with limited range of motion with flexion/extension  Skin:    General: Skin is warm and dry.  Neurological:     General: No focal deficit present.     Mental Status: She is alert and oriented to person, place, and time. Mental status is at baseline.     Comments: Patient seated comfortably in wheelchair today  Psychiatric:        Mood and Affect: Mood normal.        Behavior: Behavior normal.      UC Treatments / Results  Labs (all labs ordered are listed, but only abnormal results are displayed) Labs Reviewed - No data to display  EKG   Radiology DG Ankle Complete Left Result Date: 01/03/2024 CLINICAL DATA:  Ankle swelling EXAM: LEFT ANKLE COMPLETE - 3+ VIEW COMPARISON:  None Available. FINDINGS: Nondisplaced cortical fracture lateral malleolus. Soft tissue swelling. No other bony abnormalities. IMPRESSION: Nondisplaced cortical fracture lateral malleolus. Electronically Signed   By: Fredrich Jefferson M.D.   On: 01/03/2024 15:05     Procedures Procedures (including critical care time)  Medications Ordered in UC Medications - No data to display  Initial Impression / Assessment and Plan / UC Course  I have reviewed the triage vital signs and the nursing notes.  Pertinent labs & imaging results that were available during my care of the patient were reviewed by me and considered in my medical decision making (see chart for details).     MDM: 1.  Closed fracture of left ankle, initial encounter-left x-ray results revealed above, cam walker boot placed on left ankle with specific instructions to follow-up with [facility] for fracture management.  Rx'd Norco 5/325 mg tablet: 1 tablet every 8 hours, as needed for severe/acute left ankle pain acute left ankle pain. Advised may take Norco daily or as needed for left ankle pain.  Patient/husband/nurse advised of sedative effects of this medication.  2.  Acute left ankle pain-left CAM Walker boot placed on left ankle prior to being discharged today.  Advised patient to wear left cam walker boot 24/7 until being evaluated by orthopedics.  Encouraged to increase daily water intake to 64 ounces per day while taking this medication.  Advised patient to follow-up with [facility] for fracture management, contact information provided with his AVS today.  Final Clinical Impressions(s) / UC Diagnoses   Final diagnoses:  Acute left ankle pain  Closed fracture of left ankle, initial encounter     Discharge Instructions      Advised patient of left ankle fracture with hardcopy and images provided.  Advised may take Norco daily or as needed for left ankle pain.  Patient/husband/nurse  advised of sedative effects of this medication.  Advised patient to wear left cam walker boot 24/7 until being evaluated by orthopedics.  Encouraged to increase daily water intake to 64 ounces per day while taking this medication.  Advised patient to follow-up with [facility] for fracture management, contact information provided with his AVS today.     ED Prescriptions     Medication Sig Dispense Auth. Provider   HYDROcodone -acetaminophen  (NORCO/VICODIN) 5-325 MG tablet Take 1 tablet by mouth every 8 (eight) hours as needed. 24 tablet Tityana Pagan, FNP      PDMP not reviewed this encounter.   Leonides Ramp, FNP 01/03/24 1542

## 2024-01-03 NOTE — Telephone Encounter (Signed)
 Patient called said she needs to be fitted in within a week for fracture ankle. CB#[phone]

## 2024-01-03 NOTE — ED Notes (Addendum)
 Pt's caregiver upset of wait time, is wondering what is taking so long. Explained to caregiver xrays have been sent to radiologist and we are waiting for the read and results to be sent back. Apologized for wait. Pt caregiver rolled eyes and walked away.

## 2024-01-09 ENCOUNTER — Ambulatory Visit (INDEPENDENT_AMBULATORY_CARE_PROVIDER_SITE_OTHER): Admitting: Family

## 2024-01-09 DIAGNOSIS — S8265XA Nondisplaced fracture of lateral malleolus of left fibula, initial encounter for closed fracture: Secondary | ICD-10-CM

## 2024-01-09 MED ORDER — TRAMADOL HCL 50 MG PO TABS: 50.0000 mg | ORAL_TABLET | Freq: Four times a day (QID) | ORAL | 0 refills | Status: AC | PRN

## 2024-01-09 NOTE — Progress Notes (Unsigned)
 Office Visit Note   Patient: Katherine Benson           Date of Birth: [birth year]           MRN: [id number] Visit Date: 01/09/2024              Requested by: Rozelle Corning, MD [address] [place],  [place] [address] PCP: Rozelle Corning, MD  Chief Complaint  Patient presents with   Left Ankle - Fracture      HPI: The patient is an 83 year old woman who is seen today for evaluation of left ankle pain.  Last Wednesday she was getting out of her car when she twisted her ankle.  She felt immediate onset of pain she was able to continue bearing weight and complete her outing.  She can continue to have increased pain throughout the day  Radiographs were performed at urgent care revealing for a lateral ankle fracture has been in a cam walker full weightbearing  Complains of swelling and pain from pressure in the boot  Assessment & Plan: Visit Diagnoses: No diagnosis found.  Plan: Continue cam boot.  Padding provided to make the boot more comfortable.  She will elevate for swelling continue her compression garments for swelling follow-up in the office but with repeat radiographs in 2 weeks.  Follow-Up Instructions: No follow-ups on file.   Left Ankle Exam   Tests  Anterior drawer: negative  Other  Erythema: absent Pulse: present      Patient is alert, oriented, no adenopathy, well-dressed, normal affect, normal respiratory effort. On examination left foot mild to moderate edema present without erythema.  Tenderness over the lateral malleolus.  Lateral ankle ligaments tender  Imaging: No results found. No images are attached to the encounter.  Labs: Lab Results  Component Value Date   HGBA1C 6.1 (H) 05/20/2020   REPTSTATUS 06/02/2020 FINAL 05/30/2020   CULT (A) 05/30/2020    >=100,000 COLONIES/mL ESCHERICHIA COLI >=100,000 COLONIES/mL KLEBSIELLA PNEUMONIAE    LABORGA ESCHERICHIA COLI (A) 05/30/2020   LABORGA KLEBSIELLA PNEUMONIAE (A) 05/30/2020     No  results found for: "ALBUMIN", "PREALBUMIN", "CBC"  Lab Results  Component Value Date   MG 2.0 06/04/2020   MG 1.9 05/30/2020   MG 1.8 05/27/2020   Lab Results  Component Value Date   VD25OH 24.66 (L) 05/22/2020    No results found for: "PREALBUMIN"    Latest Ref Rng & Units 06/04/2020    2:17 AM 05/31/2020   11:37 AM 05/27/2020    3:56 AM  CBC EXTENDED  WBC 4.0 - 10.5 K/uL 6.8  8.5  5.8   RBC 3.87 - 5.11 MIL/uL 3.55  3.17  3.38   Hemoglobin 12.0 - 15.0 g/dL 19.1  9.7  47.8   HCT 29.5 - 46.0 % 32.9  29.3  30.8   Platelets 150 - 400 K/uL 463  407  325      There is no height or weight on file to calculate BMI.  Orders:  No orders of the defined types were placed in this encounter.  Meds ordered this encounter  Medications   traMADol (ULTRAM) 50 MG tablet    Sig: Take 1 tablet (50 mg total) by mouth every 6 (six) hours as needed.    Dispense:  30 tablet    Refill:  0     Procedures: No procedures performed  Clinical Data: No additional findings.  ROS:  All other systems negative, except as noted in the HPI. Review of Systems  Objective: Vital Signs: There were no vitals taken for this visit.  Specialty Comments:  No specialty comments available.  PMFS History: Patient Active Problem List   Diagnosis Date Noted   Pressure injury of skin 05/29/2020   Unresponsive episode 05/19/2020   Fall at home, initial encounter 05/19/2020   Tibial plateau fracture, left, closed, with delayed healing, subsequent encounter 05/19/2020   Acute hyponatremia 05/19/2020   Hypertension    MGUS (monoclonal gammopathy of unknown significance)    Crohn disease (HCC)    Type 2 diabetes mellitus with obesity (HCC)    Past Medical History:  Diagnosis Date   Crohn disease (HCC)    Diabetes mellitus type 2 in obese    Hypertension    MGUS (monoclonal gammopathy of unknown significance)     No family history on file.  Past Surgical History:  Procedure Laterality Date   ORIF  TIBIA PLATEAU Left 05/21/2020   Procedure: LEFT OPEN REDUCTION INTERNAL FIXATION (ORIF) TIBIAL PLATEAU;  Surgeon: Laneta Pintos, MD;  Location: [facility] OR;  Service: Orthopedics;  Laterality: Left;   Social History   Occupational History   Occupation: retired  Tobacco Use   Smoking status: Never   Smokeless tobacco: Never  Vaping Use   Vaping status: Never Used  Substance and Sexual Activity   Alcohol  use: Never   Drug use: Never   Sexual activity: Not on file

## 2024-01-10 ENCOUNTER — Encounter: Payer: Self-pay | Admitting: Family

## 2024-01-24 ENCOUNTER — Ambulatory Visit: Admitting: Family

## 2024-01-30 ENCOUNTER — Ambulatory Visit: Admitting: Family
